# Patient Record
Sex: Male | Born: 1995 | Race: White | Hispanic: No | Smoking: Never smoker
Health system: Southern US, Community
[De-identification: ages and names within clinical notes are randomized; demographics above are authoritative.]

## PROBLEM LIST (undated history)

## (undated) HISTORY — PX: HAND SURGERY: SHX662

---
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
## 2017-10-10 ENCOUNTER — Encounter (HOSPITAL_COMMUNITY): Payer: Self-pay

## 2017-10-10 ENCOUNTER — Emergency Department (HOSPITAL_COMMUNITY): Payer: Medicaid Other

## 2017-10-10 ENCOUNTER — Emergency Department (HOSPITAL_COMMUNITY)
Admission: EM | Admit: 2017-10-10 | Discharge: 2017-10-10 | Disposition: A | Discharge status: Home / Self Care | Payer: Medicaid Other | Attending: Emergency Medicine | Admitting: Emergency Medicine

## 2017-10-10 DIAGNOSIS — Z79899 Other long term (current) drug therapy: Secondary | ICD-10-CM | POA: Insufficient documentation

## 2017-10-10 DIAGNOSIS — S0083XA Contusion of other part of head, initial encounter: Secondary | ICD-10-CM

## 2017-10-10 DIAGNOSIS — Y9389 Activity, other specified: Secondary | ICD-10-CM | POA: Diagnosis not present

## 2017-10-10 DIAGNOSIS — F17210 Nicotine dependence, cigarettes, uncomplicated: Secondary | ICD-10-CM | POA: Diagnosis not present

## 2017-10-10 DIAGNOSIS — Y998 Other external cause status: Secondary | ICD-10-CM | POA: Insufficient documentation

## 2017-10-10 DIAGNOSIS — S060X0A Concussion without loss of consciousness, initial encounter: Secondary | ICD-10-CM | POA: Diagnosis not present

## 2017-10-10 DIAGNOSIS — Y929 Unspecified place or not applicable: Secondary | ICD-10-CM | POA: Insufficient documentation

## 2017-10-10 DIAGNOSIS — S0990XA Unspecified injury of head, initial encounter: Secondary | ICD-10-CM | POA: Diagnosis present

## 2017-10-10 MED ORDER — ACETAMINOPHEN 500 MG PO TABS
1000.0000 mg | ORAL_TABLET | Freq: Once | ORAL | Status: AC
  Administered 2017-10-10: 1000 mg via ORAL
  Filled 2017-10-10: qty 2
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
## 2017-10-10 NOTE — Discharge Instructions (Signed)
You have been diagnosed with a concussion. Call the concussion hotline tomorrow for follow up [phone].  Ibuprofen or Tylenol for pain Rest, ice on head.  Stay in a quiet, non-simulating, dark environment. No TV, computer use, video games until headache is resolved completely.  Follow up with your primary care physician if headache persists.  Return to the emergency department if patient becomes lethargic, begins vomiting or other change in mental status.  HEAD INJURY If any of the following occur notify your physician or go to the Hospital Emergency Department:  Increased drowsiness, stupor or loss of consciousness  Temperature above 100 F  Vomiting  Severe headache  Blood or clear fluid dripping from the nose or ears  Stiffness of the neck  Dizziness or blurred vision  Any other unusual symptoms  PRECAUTIONS  Keep head elevated at all times for the first 24 hours (Elevate mattress if pillow is ineffective)  Do not take sedatives, narcotics or alcohol  Avoid aspirin. Use only acetaminophen (e.g. Tylenol) or ibuprofen (e.g. Advil) for relief of pain. Follow directions on the bottle for dosage.  Use ice packs for comfort
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
## 2021-12-05 ENCOUNTER — Ambulatory Visit (HOSPITAL_COMMUNITY)
Admission: EM | Admit: 2021-12-05 | Discharge: 2021-12-05 | Disposition: A | Discharge status: Home / Self Care | Payer: BC Managed Care – PPO | Attending: Emergency Medicine | Admitting: Emergency Medicine

## 2021-12-05 ENCOUNTER — Other Ambulatory Visit: Payer: Self-pay

## 2021-12-05 ENCOUNTER — Encounter (HOSPITAL_COMMUNITY): Payer: Self-pay | Admitting: Emergency Medicine

## 2021-12-05 DIAGNOSIS — Z202 Contact with and (suspected) exposure to infections with a predominantly sexual mode of transmission: Secondary | ICD-10-CM | POA: Diagnosis not present

## 2021-12-05 DIAGNOSIS — Z113 Encounter for screening for infections with a predominantly sexual mode of transmission: Secondary | ICD-10-CM | POA: Insufficient documentation

## 2021-12-05 NOTE — ED Provider Notes (Signed)
[facility]    CSN: [id number] Arrival date & time: 12/05/21  1650    HISTORY   Chief Complaint  Patient presents with   SEXUALLY TRANSMITTED DISEASE   HPI [name] [name] is a 26 y.o. male. Patient presents for STD screening.  Patient reports new sexual partner, states he recently had unprotected sexual intercourse, patient denies having any symptoms such as discharge, burning with urination, scrotal pain or tenderness, testicular pain or tenderness, scrotal swelling, genital lesions.  Patient denies known exposure to STD.  Patient denies known history of STD.  The history is provided by the patient.  History reviewed. No pertinent past medical history. Patient Active Problem List   Diagnosis Date Noted   Mood disorder in conditions classified elsewhere 04/05/2015   Polysubstance abuse (HCC) 04/05/2015   Marijuana abuse 04/04/2015   Depression, major, single episode, severe (HCC) 04/04/2015   Suicide threat or attempt 04/04/2015   Suicidal ideation    Past Surgical History:  Procedure Laterality Date   CLOSED REDUCTION NASAL FRACTURE N/A 03/18/2015   Procedure: CLOSED REDUCTION NASAL FRACTURE WITH STABILIZATION;  Surgeon: Flo Shanks, MD;  Location: [facility] OR;  Service: ENT;  Laterality: N/A;   HAND SURGERY Left     Home Medications    Prior to Admission medications   Not on File   Family History No family history on file. Social History Social History   Tobacco Use   Smoking status: Every Day    Packs/day: 1.00    Types: Cigarettes   Smokeless tobacco: Never  Substance Use Topics   Alcohol use: No   Drug use: Yes    Types: Marijuana   Allergies   Root beer flavor  Review of Systems Review of Systems Pertinent findings noted in history of present illness.   Physical Exam Triage Vital Signs ED Triage Vitals  Enc Vitals Group     BP 09/12/21 0827 (!) 147/82     Pulse Rate 09/12/21 0827 72     Resp 09/12/21 0827 18     Temp 09/12/21 0827  98.3 F (36.8 C)     Temp Source 09/12/21 0827 Oral     SpO2 09/12/21 0827 98 %     Weight --      Height --      Head Circumference --      Peak Flow --      Pain Score 09/12/21 0826 5     Pain Loc --      Pain Edu? --      Excl. in GC? --   No data found.  Updated Vital Signs BP (!) 139/92 (BP Location: Left Arm)    Pulse 99    Temp 98.5 F (36.9 C) (Oral)    Resp 16    SpO2 98%   Physical Exam Vitals and nursing note reviewed.  Constitutional:      General: He is not in acute distress.    Appearance: Normal appearance. He is not ill-appearing.  HENT:     Head: Normocephalic and atraumatic.  Eyes:     General: Lids are normal.        Right eye: No discharge.        Left eye: No discharge.     Extraocular Movements: Extraocular movements intact.     Conjunctiva/sclera: Conjunctivae normal.     Right eye: Right conjunctiva is not injected.     Left eye: Left conjunctiva is not injected.  Neck:     Trachea:  Trachea and phonation normal.  Cardiovascular:     Rate and Rhythm: Normal rate and regular rhythm.     Pulses: Normal pulses.     Heart sounds: Normal heart sounds. No murmur heard.   No friction rub. No gallop.  Pulmonary:     Effort: Pulmonary effort is normal. No accessory muscle usage, prolonged expiration or respiratory distress.     Breath sounds: Normal breath sounds. No stridor, decreased air movement or transmitted upper airway sounds. No decreased breath sounds, wheezing, rhonchi or rales.  Chest:     Chest wall: No tenderness.  Genitourinary:    Comments: Pt politely declines GU exam, pt did provide a penile swab for testing.   Musculoskeletal:        General: Normal range of motion.     Cervical back: Normal range of motion and neck supple. Normal range of motion.  Lymphadenopathy:     Cervical: No cervical adenopathy.  Skin:    General: Skin is warm and dry.     Findings: No erythema or rash.  Neurological:     General: No focal deficit  present.     Mental Status: He is alert and oriented to person, place, and time.  Psychiatric:        Mood and Affect: Mood normal.        Behavior: Behavior normal.    Visual Acuity Right Eye Distance:   Left Eye Distance:   Bilateral Distance:    Right Eye Near:   Left Eye Near:    Bilateral Near:     UC Couse / Diagnostics / Procedures:    EKG  Radiology No results found.  Procedures Procedures (including critical care time)  UC Diagnoses / Final Clinical Impressions(s)   I have reviewed the triage vital signs and the nursing notes.  Pertinent labs & imaging results that were available during my care of the patient were reviewed by me and considered in my medical decision making (see chart for details).    Final diagnoses:  Screening examination for STD (sexually transmitted disease)   STD screening was performed, patient advised that the results be posted to their MyChart and if any of the results are positive, they will be notified by phone, further treatment will be provided as indicated based on results of STD screening.  ED Prescriptions   None    PDMP not reviewed this encounter.  Pending results:  Labs Reviewed  CYTOLOGY, (ORAL, ANAL, URETHRAL) ANCILLARY ONLY    Medications Ordered in UC: Medications - No data to display  Disposition Upon Discharge:  Condition: stable for discharge home Home: take medications as prescribed; routine discharge instructions as discussed; follow up as advised.  Patient presented with an acute illness with associated systemic symptoms and significant discomfort requiring urgent management. In my opinion, this is a condition that a prudent lay person (someone who possesses an average knowledge of health and medicine) may potentially expect to result in complications if not addressed urgently such as respiratory distress, impairment of bodily function or dysfunction of bodily organs.   Routine symptom specific, illness  specific and/or disease specific instructions were discussed with the patient and/or caregiver at length.   As such, the patient has been evaluated and assessed, work-up was performed and treatment was provided in alignment with urgent care protocols and evidence based medicine.  Patient/parent/caregiver has been advised that the patient may require follow up for further testing and treatment if the symptoms continue in spite of treatment,  as clinically indicated and appropriate.  Patient/parent/caregiver has been advised to return to the [facility] or PCP in 3-5 days if no better; to PCP or the Emergency Department if new signs and symptoms develop, or if the current signs or symptoms continue to change or worsen for further workup, evaluation and treatment as clinically indicated and appropriate  The patient will follow up with their current PCP if and as advised. If the patient does not currently have a PCP we will assist them in obtaining one.   The patient may need specialty follow up if the symptoms continue, in spite of conservative treatment and management, for further workup, evaluation, consultation and treatment as clinically indicated and appropriate.  Patient/parent/caregiver verbalized understanding and agreement of plan as discussed.  All questions were addressed during visit.  Please see discharge instructions below for further details of plan.  Discharge Instructions:   Discharge Instructions      The results of your STD testing today will be made available to you once received.  They will initially be posted to your MyChart and, if any of your results are abnormal, you will receive a phone call with those results along with further instructions regarding any further treatment, if needed.        This office note has been dictated using Teaching laboratory technician.  Unfortunately, and despite my best efforts, this method of dictation can sometimes lead to occasional  typographical or grammatical errors.  I apologize in advance if this occurs.      Theadora Rama Scales, PA-C 12/05/21 1722
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
## 2021-12-05 NOTE — Discharge Instructions (Addendum)
The results of your STD testing today will be made available to you once received.  They will initially be posted to your MyChart and, if any of your results are abnormal, you will receive a phone call with those results along with further instructions regarding any further treatment, if needed.

## 2021-12-08 LAB — CYTOLOGY, (ORAL, ANAL, URETHRAL) ANCILLARY ONLY
Chlamydia: NEGATIVE
Comment: NEGATIVE
Comment: NEGATIVE
Comment: NORMAL
Neisseria Gonorrhea: NEGATIVE
Trichomonas: NEGATIVE

## 2023-05-23 ENCOUNTER — Other Ambulatory Visit: Payer: Self-pay

## 2023-05-23 ENCOUNTER — Encounter (HOSPITAL_COMMUNITY): Payer: Self-pay

## 2023-05-23 ENCOUNTER — Emergency Department (HOSPITAL_COMMUNITY)
Admission: EM | Admit: 2023-05-23 | Discharge: 2023-05-24 | Disposition: A | Discharge status: Home / Self Care | Payer: Medicaid Other | Source: Home / Self Care | Attending: Emergency Medicine | Admitting: Emergency Medicine

## 2023-05-23 DIAGNOSIS — T1491XA Suicide attempt, initial encounter: Secondary | ICD-10-CM | POA: Insufficient documentation

## 2023-05-23 DIAGNOSIS — X58XXXA Exposure to other specified factors, initial encounter: Secondary | ICD-10-CM | POA: Insufficient documentation

## 2023-05-23 DIAGNOSIS — R000 Tachycardia, unspecified: Secondary | ICD-10-CM | POA: Insufficient documentation

## 2023-05-23 DIAGNOSIS — F1099 Alcohol use, unspecified with unspecified alcohol-induced disorder: Secondary | ICD-10-CM | POA: Insufficient documentation

## 2023-05-23 DIAGNOSIS — F1994 Other psychoactive substance use, unspecified with psychoactive substance-induced mood disorder: Secondary | ICD-10-CM | POA: Diagnosis present

## 2023-05-23 DIAGNOSIS — F17210 Nicotine dependence, cigarettes, uncomplicated: Secondary | ICD-10-CM | POA: Insufficient documentation

## 2023-05-23 DIAGNOSIS — F1094 Alcohol use, unspecified with alcohol-induced mood disorder: Secondary | ICD-10-CM

## 2023-05-23 LAB — COMPREHENSIVE METABOLIC PANEL
ALT: 31 U/L (ref 0–44)
AST: 37 U/L (ref 15–41)
Albumin: 4.6 g/dL (ref 3.5–5.0)
Alkaline Phosphatase: 65 U/L (ref 38–126)
Anion gap: 11 (ref 5–15)
BUN: 9 mg/dL (ref 6–20)
CO2: 22 mmol/L (ref 22–32)
Calcium: 8.8 mg/dL — ABNORMAL LOW (ref 8.9–10.3)
Chloride: 105 mmol/L (ref 98–111)
Creatinine, Ser: 0.83 mg/dL (ref 0.61–1.24)
GFR, Estimated: >60 mL/min (ref >60)
Glucose, Bld: 97 mg/dL (ref 70–99)
Potassium: 3.5 mmol/L (ref 3.5–5.1)
Sodium: 138 mmol/L (ref 135–145)
Total Bilirubin: 0.6 mg/dL (ref 0.3–1.2)
Total Protein: 8 g/dL (ref 6.5–8.1)

## 2023-05-23 LAB — ETHANOL: Alcohol, Ethyl (B): 160 mg/dL — ABNORMAL HIGH (ref <10)

## 2023-05-23 LAB — CBC WITH DIFFERENTIAL/PLATELET
Abs Immature Granulocytes: 0.02 10*3/uL (ref 0.00–0.07)
Basophils Absolute: 0.1 10*3/uL (ref 0.0–0.1)
Basophils Relative: 1 %
Eosinophils Absolute: 0.1 10*3/uL (ref 0.0–0.5)
Eosinophils Relative: 1 %
HCT: 47.2 % (ref 39.0–52.0)
Hemoglobin: 15.3 g/dL (ref 13.0–17.0)
Immature Granulocytes: 0 %
Lymphocytes Relative: 27 %
Lymphs Abs: 2.1 10*3/uL (ref 0.7–4.0)
MCH: 30.8 pg (ref 26.0–34.0)
MCHC: 32.4 g/dL (ref 30.0–36.0)
MCV: 95 fL (ref 80.0–100.0)
Monocytes Absolute: 0.5 10*3/uL (ref 0.1–1.0)
Monocytes Relative: 6 %
Neutro Abs: 5 10*3/uL (ref 1.7–7.7)
Neutrophils Relative %: 65 %
Platelets: 240 10*3/uL (ref 150–400)
RBC: 4.97 MIL/uL (ref 4.22–5.81)
RDW: 13 % (ref 11.5–15.5)
WBC: 7.7 10*3/uL (ref 4.0–10.5)
nRBC: 0 % (ref 0.0–0.2)

## 2023-05-23 LAB — RAPID URINE DRUG SCREEN, HOSP PERFORMED
Amphetamines: NOT DETECTED
Barbiturates: NOT DETECTED
Benzodiazepines: NOT DETECTED
Cocaine: POSITIVE — AB
Opiates: NOT DETECTED
Tetrahydrocannabinol: POSITIVE — AB

## 2023-05-23 MED ORDER — ADULT MULTIVITAMIN W/MINERALS CH
1.0000 | ORAL_TABLET | Freq: Every day | ORAL | Status: DC
  Administered 2023-05-23 – 2023-05-24 (×2): 1 via ORAL
  Filled 2023-05-23 (×2): qty 1

## 2023-05-23 MED ORDER — LORAZEPAM 2 MG/ML IJ SOLN: 1.0000 mg | INTRAMUSCULAR | Status: DC | PRN

## 2023-05-23 MED ORDER — FOLIC ACID 1 MG PO TABS
1.0000 mg | ORAL_TABLET | Freq: Every day | ORAL | Status: DC
  Administered 2023-05-23 – 2023-05-24 (×2): 1 mg via ORAL
  Filled 2023-05-23 (×2): qty 1

## 2023-05-23 MED ORDER — THIAMINE HCL 100 MG/ML IJ SOLN: 100.0000 mg | Freq: Every day | INTRAMUSCULAR | Status: DC

## 2023-05-23 MED ORDER — LORAZEPAM 1 MG PO TABS: 1.0000 mg | ORAL_TABLET | ORAL | Status: DC | PRN

## 2023-05-23 MED ORDER — THIAMINE MONONITRATE 100 MG PO TABS
100.0000 mg | ORAL_TABLET | Freq: Every day | ORAL | Status: DC
  Administered 2023-05-23 – 2023-05-24 (×2): 100 mg via ORAL
  Filled 2023-05-23 (×2): qty 1
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
## 2023-05-23 NOTE — Consult Note (Signed)
[facility] ED ASSESSMENT   Reason for Consult:  Psychiatry evaluation Referring Physician:  ER Physician Patient Identification: [name] [name] MRN:  [id number] ED Chief Complaint: Attempted suicide [facility])  Diagnosis:  Principal Problem:   Attempted suicide [facility]) Active Problems:   Substance induced mood disorder (HCC)   Alcohol use with alcohol-induced disorder [facility])   ED Assessment Time Calculation: Start Time: 1753 Stop Time: 1824 Total Time in Minutes (Assessment Completion): 31   Subjective:   [name] is a 27 y.o. male patient admitted with previous hx of .Suicide ideation/attempt, Alcohol use disorder, Cannabis use disorder and Depression was brought in by [facility] after his GF called after patient made a suicide statement and showed the Police a Video of him trying to hang himself.  HPI:  Patient was seen this evening for evaluation.  He admitted to sending a video of him trying to hang himself to his girl Friend.  Patient states he did not want to kill himself but wanted to get his girl friend worried.  Patient reports that his GF who is soon to deliver twin girls always insults him, tells him to kill himself.  Patient states that his GF makes him feel like nothing.  Patient reports that his GF breaks his heart daily by finding every means on earth to make him feel worthless.  Patient denies family hx of suicide and denies ever trying to hurt self in the past.  When asked about drug patient reported using Cannabis and drinks occasionally.  Patient admits to poor sleep and appetite.  He admits to not being able to keep a job for long.    Patient and his GF lives with his parents and grandmother.   Male, 27 years old who appears older than stated age was seen emaciated, disheveled and restless.  He denied using alcohol in 24 hrs but Alcohol level was 160 and UDS is positive for Cannabis and Marijuana.  Patient denies SI/HI/AVH.Marland Kitchen Collateral information from mom, Dad and GF is that ever since  patient started using Alcohol and drugs as a teenager he has not been himself.  Parents reports that he has been dealing with extensive alcohol and various drug use.Marland Kitchen  He has not been to any rehab treatment before.  His father reported  a time he was supposed to be in court for a hearing and inside the court he brought out a blade and cut his left wrist deeply and needed sutures.  Mother reports that he is always making suicide comments around in the house. Patient is a threat to himself and meets criteria for inpatient Psychiatric hospitalization.  We will seek bed placement at any facility that has available bed.  We will place patient on CIWA protocol using Ativan.  Past Psychiatric History: previous hx of .Suicide ideation/attempt, Alcohol use disorder, Cannabis use disorder and Depression.  No previous detox treatment of mental health care  Risk to Self or Others: Is the patient at risk to self? Yes Has the patient been a risk to self in the past 6 months? yes Has the patient been a risk to self within the distant past? Yes Is the patient a risk to others? No Has the patient been a risk to others in the past 6 months? No Has the patient been a risk to others within the distant past? No  [place] Scale:  Flowsheet Row ED from 05/23/2023 in [facility] Emergency Department at [facility] ED from 12/05/2021 in [facility] at [facility]  C-SSRS RISK CATEGORY No Risk No Risk       AIMS:  , , ,  ,   ASAM:    Substance Abuse:     Past Medical History: History reviewed. No pertinent past medical history.  Past Surgical History:  Procedure Laterality Date   CLOSED REDUCTION NASAL FRACTURE N/A 03/18/2015   Procedure: CLOSED REDUCTION NASAL FRACTURE WITH STABILIZATION;  Surgeon: Flo Shanks, MD;  Location: [facility] OR;  Service: ENT;  Laterality: N/A;   HAND SURGERY Left    Family History: History reviewed. No pertinent family history. Family Psychiatric  History: Parents  denied Social History:  Social History   Substance and Sexual Activity  Alcohol Use No     Social History   Substance and Sexual Activity  Drug Use Yes   Types: Marijuana    Social History   Socioeconomic History   Marital status: Single    Spouse name: Not on file   Number of children: Not on file   Years of education: Not on file   Highest education level: Not on file  Occupational History   Not on file  Tobacco Use   Smoking status: Every Day    Packs/day: 1    Types: Cigarettes   Smokeless tobacco: Never  Substance and Sexual Activity   Alcohol use: No   Drug use: Yes    Types: Marijuana   Sexual activity: Yes    Birth control/protection: Condom  Other Topics Concern   Not on file  Social History Narrative   Not on file   Social Determinants of Health   Financial Resource Strain: Not on file  Food Insecurity: Not on file  Transportation Needs: Not on file  Physical Activity: Not on file  Stress: Not on file  Social Connections: Not on file   Additional Social History:    Allergies:   Allergies  Allergen Reactions   Root Beer Flavor Nausea Only    And cream soda    Labs:  Results for orders placed or performed during the hospital encounter of 05/23/23 (from the past 48 hour(s))  Comprehensive metabolic panel     Status: Abnormal   Collection Time: 05/23/23 12:46 PM  Result Value Ref Range   Sodium 138 135 - 145 mmol/L   Potassium 3.5 3.5 - 5.1 mmol/L   Chloride 105 98 - 111 mmol/L   CO2 22 22 - 32 mmol/L   Glucose, Bld 97 70 - 99 mg/dL    Comment: Glucose reference range applies only to samples taken after fasting for at least 8 hours.   BUN 9 6 - 20 mg/dL   Creatinine, Ser 4.09 0.61 - 1.24 mg/dL   Calcium 8.8 (L) 8.9 - 10.3 mg/dL   Total Protein 8.0 6.5 - 8.1 g/dL   Albumin 4.6 3.5 - 5.0 g/dL   AST 37 15 - 41 U/L   ALT 31 0 - 44 U/L   Alkaline Phosphatase 65 38 - 126 U/L   Total Bilirubin 0.6 0.3 - 1.2 mg/dL   GFR, Estimated >81 >19  mL/min    Comment: (NOTE) Calculated using the CKD-EPI Creatinine Equation (2021)    Anion gap 11 5 - 15    Comment: Performed at [facility], [address] [place], [place] [address]  Ethanol     Status: Abnormal   Collection Time: 05/23/23 12:46 PM  Result Value Ref Range   Alcohol, Ethyl (B) 160 (H) <10 mg/dL    Comment: (NOTE) Lowest  detectable limit for serum alcohol is 10 mg/dL.  For medical purposes only. Performed at [facility], [address], [place], [place] [address]   Urine rapid drug screen (hosp performed)     Status: Abnormal   Collection Time: 05/23/23 12:46 PM  Result Value Ref Range   Opiates NONE DETECTED NONE DETECTED   Cocaine POSITIVE (A) NONE DETECTED   Benzodiazepines NONE DETECTED NONE DETECTED   Amphetamines NONE DETECTED NONE DETECTED   Tetrahydrocannabinol POSITIVE (A) NONE DETECTED   Barbiturates NONE DETECTED NONE DETECTED    Comment: (NOTE) DRUG SCREEN FOR MEDICAL PURPOSES ONLY.  IF CONFIRMATION IS NEEDED FOR ANY PURPOSE, NOTIFY LAB WITHIN 5 DAYS.  LOWEST DETECTABLE LIMITS FOR URINE DRUG SCREEN Drug Class                     Cutoff (ng/mL) Amphetamine and metabolites    1000 Barbiturate and metabolites    200 Benzodiazepine                 200 Opiates and metabolites        300 Cocaine and metabolites        300 THC                            50 Performed at [facility], [address] [place], [place] [address]   CBC with Diff     Status: None   Collection Time: 05/23/23 12:46 PM  Result Value Ref Range   WBC 7.7 4.0 - 10.5 K/uL   RBC 4.97 4.22 - 5.81 MIL/uL   Hemoglobin 15.3 13.0 - 17.0 g/dL   HCT 09.8 11.9 - 14.7 %   MCV 95.0 80.0 - 100.0 fL   MCH 30.8 26.0 - 34.0 pg   MCHC 32.4 30.0 - 36.0 g/dL   RDW 82.9 56.2 - 13.0 %   Platelets 240 150 - 400 K/uL   nRBC 0.0 0.0 - 0.2 %   Neutrophils Relative % 65 %   Neutro Abs 5.0 1.7 - 7.7 K/uL   Lymphocytes Relative 27 %    Lymphs Abs 2.1 0.7 - 4.0 K/uL   Monocytes Relative 6 %   Monocytes Absolute 0.5 0.1 - 1.0 K/uL   Eosinophils Relative 1 %   Eosinophils Absolute 0.1 0.0 - 0.5 K/uL   Basophils Relative 1 %   Basophils Absolute 0.1 0.0 - 0.1 K/uL   Immature Granulocytes 0 %   Abs Immature Granulocytes 0.02 0.00 - 0.07 K/uL    Comment: Performed at [facility] [address], [place], [place] [address]    Current Facility-Administered Medications  Medication Dose Route Frequency Provider Last Rate Last Admin   folic acid (FOLVITE) tablet 1 mg  1 mg Oral Daily Norvella Loscalzo C, NP       LORazepam (ATIVAN) tablet 1-4 mg  1-4 mg Oral Q1H PRN Dahlia Byes C, NP       Or   LORazepam (ATIVAN) injection 1-4 mg  1-4 mg Intravenous Q1H PRN Dahlia Byes C, NP       multivitamin with minerals tablet 1 tablet  1 tablet Oral Daily Yanilen Adamik C, NP       thiamine (VITAMIN B1) tablet 100 mg  100 mg Oral Daily Naida Escalante C, NP       Or   thiamine (VITAMIN B1) injection 100 mg  100 mg Intravenous Daily Sarie Stall,  Hessie Dibble, NP       No current outpatient medications on file.    Musculoskeletal: Strength & Muscle Tone: within normal limits Gait & Station: normal Patient leans: Front   Psychiatric Specialty Exam: Presentation  General Appearance:  Disheveled; Casual  Eye Contact: Good  Speech: Clear and Coherent; Pressured  Speech Volume: Normal  Handedness: Right   Mood and Affect  Mood: Angry; Anxious; Irritable  Affect: Congruent; Tearful   Thought Process  Thought Processes: Coherent; Goal Directed; Linear  Descriptions of Associations:Circumstantial  Orientation:No data recorded Thought Content:Logical; Illogical; Scattered  History of Schizophrenia/Schizoaffective disorder:No data recorded Duration of Psychotic Symptoms:No data recorded Hallucinations:Hallucinations: None  Ideas of Reference:None  Suicidal Thoughts:Suicidal  Thoughts: No  Homicidal Thoughts:Homicidal Thoughts: No   Sensorium  Memory: Immediate Good; Recent Good; Remote Fair  Judgment: Impaired  Insight: Lacking   Executive Functions  Concentration: Poor  Attention Span: Poor  Recall:No data recorded Fund of Knowledge: Fair  Language: Good   Psychomotor Activity  Psychomotor Activity: Psychomotor Activity: Normal   Assets  Assets: Communication Skills; Intimacy; Housing; Desire for Improvement; Physical Health    Sleep  Sleep: Sleep: Fair   Physical Exam: Physical Exam Vitals and nursing note reviewed.  HENT:     Head: Normocephalic and atraumatic.     Nose: Nose normal.  Cardiovascular:     Rate and Rhythm: Normal rate and regular rhythm.  Pulmonary:     Effort: Pulmonary effort is normal.  Musculoskeletal:        General: Normal range of motion.     Cervical back: Normal range of motion.  Skin:    General: Skin is warm and dry.  Neurological:     Mental Status: He is alert and oriented to person, place, and time.  Psychiatric:        Attention and Perception: He is inattentive.        Mood and Affect: Mood is anxious and elated. Affect is tearful.        Speech: Speech is rapid and pressured.        Behavior: Behavior is hyperactive. Behavior is cooperative.        Thought Content: Thought content includes suicidal ideation.        Cognition and Memory: Cognition and memory normal.        Judgment: Judgment is impulsive and inappropriate.    Review of Systems  Constitutional:  Positive for weight loss.  HENT: Negative.    Eyes: Negative.   Respiratory: Negative.    Cardiovascular: Negative.   Gastrointestinal: Negative.   Genitourinary: Negative.   Musculoskeletal: Negative.   Skin: Negative.   Neurological: Negative.   Endo/Heme/Allergies: Negative.   Psychiatric/Behavioral:  Positive for substance abuse and suicidal ideas. The patient is nervous/anxious.    Blood pressure (!)  156/101, pulse (!) 121, temperature 98.3 F (36.8 C), temperature source Oral, resp. rate 20, height 5\' 10"  (1.778 m), weight 72.6 kg, SpO2 100 %. Body mass index is 22.96 kg/m.  Medical Decision Making: Patient is a threat to himself and meets criteria for inpatient Psychiatric hospitalization.  We will seek bed placement at any facility that has available bed.  We will place patient on CIWA protocol using Ativan  Problem 1: Suicide attempt  Problem 2: Alcohol use with alcohol induced mood disorder  Problem 3: Substance induced mood disorder  Disposition:  admit, seek bed placement.  Earney Navy, NP-PMHNP-BC 05/23/2023 6:27 PM

## 2023-05-23 NOTE — ED Notes (Signed)
Psych NP at bedside

## 2023-05-23 NOTE — ED Notes (Signed)
Pt placed personal phone call at this time.

## 2023-05-23 NOTE — ED Notes (Signed)
Pt has one bag of belongings in cabinet labeled 16-18.  Pt has been seen and wand by security.

## 2023-05-23 NOTE — ED Notes (Signed)
Vitals will be taken when pt wakes up

## 2023-05-23 NOTE — ED Notes (Signed)
Pt's girlfriend at bedside visiting.

## 2023-05-23 NOTE — ED Provider Notes (Signed)
[place] EMERGENCY DEPARTMENT AT [facility] Provider Note   CSN: [id number] Arrival date & time: 05/23/23  1220     History  Chief Complaint  Patient presents with   Psychiatric Evaluation    [name] [name] is a 27 y.o. male with history of polysubstance abuse, depression, mood disorder, who presents the emergency department under IVC.  Per IVC paperwork, "respondent sent pictures and text messages started to hang himself.  Respondent threatened to harm himself by wrecking his truck while on the phone with his girlfriend."  Patient denies active SI, HI, or AVH. States he just took those pictures/videos because he was trying to "get back at" his girlfriend due to her verbal abuse. States they are having a child together in about 5 weeks or so. Reports girlfriend has been telling him he is worthless and would be better off dead. He denies any intent to harm himself now and that he "just did something stupid". Endorses having a "drinking problem" as a way to cope with the verbal abuse he receives.   HPI     Home Medications Prior to Admission medications   Not on File      Allergies    Root beer flavor    Review of Systems   Review of Systems  Psychiatric/Behavioral:  Positive for suicidal ideas.   All other systems reviewed and are negative.   Physical Exam Updated Vital Signs BP (!) 156/101 (BP Location: Right Arm)   Pulse (!) 121   Temp 98.3 F (36.8 C) (Oral)   Resp 20   Ht 5\' 10"  (1.778 m)   Wt 72.6 kg   SpO2 100%   BMI 22.96 kg/m  Physical Exam Vitals and nursing note reviewed.  Constitutional:      Appearance: Normal appearance.  HENT:     Head: Normocephalic and atraumatic.  Eyes:     Conjunctiva/sclera: Conjunctivae normal.  Pulmonary:     Effort: Pulmonary effort is normal. No respiratory distress.  Skin:    General: Skin is warm and dry.  Neurological:     Mental Status: He is alert.  Psychiatric:        Attention and Perception:  Attention and perception normal.        Mood and Affect: Mood normal. Affect is tearful.        Speech: Speech normal.        Behavior: Behavior normal. Behavior is cooperative.        Thought Content: Thought content includes suicidal ideation. Thought content does not include homicidal ideation. Thought content does not include homicidal or suicidal plan.     ED Results / Procedures / Treatments   Labs (all labs ordered are listed, but only abnormal results are displayed) Labs Reviewed  COMPREHENSIVE METABOLIC PANEL - Abnormal; Notable for the following components:      Result Value   Calcium 8.8 (*)    All other components within normal limits  ETHANOL - Abnormal; Notable for the following components:   Alcohol, Ethyl (B) 160 (*)    All other components within normal limits  RAPID URINE DRUG SCREEN, HOSP PERFORMED - Abnormal; Notable for the following components:   Cocaine POSITIVE (*)    Tetrahydrocannabinol POSITIVE (*)    All other components within normal limits  CBC WITH DIFFERENTIAL/PLATELET    EKG EKG Interpretation Date/Time:  Sunday May 23 2023 13:25:45 EDT Ventricular Rate:  106 PR Interval:  126 QRS Duration:  90 QT Interval:  346 QTC  Calculation: 459 R Axis:   64  Text Interpretation: Sinus tachycardia Otherwise normal ECG No significant change since last tracing Confirmed by Elayne Snare ([id number]) on 05/23/2023 1:32:58 PM  Radiology No results found.  Procedures Procedures    Medications Ordered in ED Medications - No data to display  ED Course/ Medical Decision Making/ A&P                             Medical Decision Making Amount and/or Complexity of Data Reviewed Labs: ordered.   Patient is a 27 y.o. male  who presents to the emergency department for psychiatric complaint. Brought in under IVC after he sent texts and images of him threatening suicide.   Past Medical History: polysubstance abuse, depression, mood disorder  Physical  Exam: Hypertensive and tachycardic, otherwise normal vitals. No distress. Sitting comfortably in exam bed. Denies active SI, HI, or AVH.  Labs: Medical clearance labs ordered, with following pertinent results: CBC and CMP unremarkable. Ethanol 160. UDS positive for cocaine and THC.   Cardiac monitoring: EKG obtained and interpreted by attending physician which shows: sinus tachycardia  Disposition: Patient is otherwise medically cleared at this time pending medical clearance laboratory evaluation. Will consult TTS and appreciate their recommendations. Patient here under IVC. Although he does not have active threats of suicide and claims not to be a danger to himself, his threats/actions earlier today are very concerning and do not feel we can contract for safety under current circumstances. Would appreciate psychiatric evaluation and assistance in disposition. IVC first exam completed.   I discussed this case with my attending physician Dr. Theresia Lo who cosigned this note including patient's presenting symptoms, physical exam, and planned diagnostics and interventions. Attending physician stated agreement with plan or made changes to plan which were implemented.  1816 --  Behavioral health NP Dahlia Byes evaluated patient and believes he meets criteria for inpatient psychiatric treatment. Diet ordered. No home medications to be ordered.   Final Clinical Impression(s) / ED Diagnoses Final diagnoses:  Attempted suicide (HCC)  Substance induced mood disorder (HCC)  Alcohol use with alcohol-induced mood disorder (HCC)    Rx / DC Orders ED Discharge Orders     None      Portions of this report may have been transcribed using voice recognition software. Every effort was made to ensure accuracy; however, inadvertent computerized transcription errors may be present.    Su Monks, PA-C 05/23/23 1818    Rexford Maus, DO 05/23/23 1927

## 2023-05-24 ENCOUNTER — Encounter (HOSPITAL_COMMUNITY): Payer: Self-pay | Admitting: Nurse Practitioner

## 2023-05-24 ENCOUNTER — Other Ambulatory Visit: Payer: Self-pay

## 2023-05-24 ENCOUNTER — Inpatient Hospital Stay (HOSPITAL_COMMUNITY)
Admission: AD | Admit: 2023-05-24 | Discharge: 2023-05-28 | Disposition: A | Discharge status: Home / Self Care | Payer: Medicaid Other | Source: Intra-hospital | Attending: Psychiatry | Admitting: Psychiatry

## 2023-05-24 DIAGNOSIS — F32A Depression, unspecified: Secondary | ICD-10-CM | POA: Diagnosis present

## 2023-05-24 DIAGNOSIS — Y906 Blood alcohol level of 120-199 mg/100 ml: Secondary | ICD-10-CM | POA: Diagnosis present

## 2023-05-24 DIAGNOSIS — R45851 Suicidal ideations: Secondary | ICD-10-CM | POA: Diagnosis present

## 2023-05-24 DIAGNOSIS — F1024 Alcohol dependence with alcohol-induced mood disorder: Principal | ICD-10-CM | POA: Diagnosis present

## 2023-05-24 DIAGNOSIS — F17210 Nicotine dependence, cigarettes, uncomplicated: Secondary | ICD-10-CM | POA: Diagnosis present

## 2023-05-24 DIAGNOSIS — T1491XA Suicide attempt, initial encounter: Principal | ICD-10-CM | POA: Diagnosis present

## 2023-05-24 DIAGNOSIS — Z9151 Personal history of suicidal behavior: Secondary | ICD-10-CM | POA: Diagnosis not present

## 2023-05-24 DIAGNOSIS — F1099 Alcohol use, unspecified with unspecified alcohol-induced disorder: Secondary | ICD-10-CM | POA: Diagnosis present

## 2023-05-24 DIAGNOSIS — F1020 Alcohol dependence, uncomplicated: Principal | ICD-10-CM | POA: Insufficient documentation

## 2023-05-24 DIAGNOSIS — F0630 Mood disorder due to known physiological condition, unspecified: Secondary | ICD-10-CM

## 2023-05-24 DIAGNOSIS — Z9152 Personal history of nonsuicidal self-harm: Secondary | ICD-10-CM

## 2023-05-24 DIAGNOSIS — F1410 Cocaine abuse, uncomplicated: Secondary | ICD-10-CM | POA: Diagnosis present

## 2023-05-24 MED ORDER — LORAZEPAM 1 MG PO TABS: 1.0000 mg | ORAL_TABLET | ORAL | Status: DC | PRN

## 2023-05-24 MED ORDER — ADULT MULTIVITAMIN W/MINERALS CH
1.0000 | ORAL_TABLET | Freq: Every day | ORAL | Status: DC
  Administered 2023-05-25 – 2023-05-28 (×4): 1 via ORAL
  Filled 2023-05-24 (×5): qty 1

## 2023-05-24 MED ORDER — ALUM & MAG HYDROXIDE-SIMETH 200-200-20 MG/5ML PO SUSP: 30.0000 mL | ORAL | Status: DC | PRN

## 2023-05-24 MED ORDER — ACETAMINOPHEN 325 MG PO TABS
650.0000 mg | ORAL_TABLET | Freq: Four times a day (QID) | ORAL | Status: DC | PRN
  Administered 2023-05-26: 650 mg via ORAL
  Filled 2023-05-24: qty 2

## 2023-05-24 MED ORDER — VITAMIN B-1 100 MG PO TABS
100.0000 mg | ORAL_TABLET | Freq: Every day | ORAL | Status: DC
  Administered 2023-05-25 – 2023-05-28 (×4): 100 mg via ORAL
  Filled 2023-05-24 (×5): qty 1

## 2023-05-24 MED ORDER — DIPHENHYDRAMINE HCL 25 MG PO CAPS: 50.0000 mg | ORAL_CAPSULE | Freq: Three times a day (TID) | ORAL | Status: DC | PRN

## 2023-05-24 MED ORDER — HALOPERIDOL 5 MG PO TABS: 5.0000 mg | ORAL_TABLET | Freq: Three times a day (TID) | ORAL | Status: DC | PRN

## 2023-05-24 MED ORDER — DIPHENHYDRAMINE HCL 50 MG/ML IJ SOLN: 50.0000 mg | Freq: Three times a day (TID) | INTRAMUSCULAR | Status: DC | PRN

## 2023-05-24 MED ORDER — LORAZEPAM 1 MG PO TABS: 1.0000 mg | ORAL_TABLET | Freq: Four times a day (QID) | ORAL | Status: AC | PRN

## 2023-05-24 MED ORDER — LORAZEPAM 1 MG PO TABS: 2.0000 mg | ORAL_TABLET | Freq: Three times a day (TID) | ORAL | Status: DC | PRN

## 2023-05-24 MED ORDER — THIAMINE HCL 100 MG/ML IJ SOLN: 100.0000 mg | Freq: Every day | INTRAMUSCULAR | Status: DC

## 2023-05-24 MED ORDER — LORAZEPAM 2 MG/ML IJ SOLN: 2.0000 mg | Freq: Three times a day (TID) | INTRAMUSCULAR | Status: DC | PRN

## 2023-05-24 MED ORDER — FOLIC ACID 1 MG PO TABS
1.0000 mg | ORAL_TABLET | Freq: Every day | ORAL | Status: DC
  Administered 2023-05-25 – 2023-05-28 (×4): 1 mg via ORAL
  Filled 2023-05-24 (×6): qty 1

## 2023-05-24 MED ORDER — MAGNESIUM HYDROXIDE 400 MG/5ML PO SUSP: 30.0000 mL | Freq: Every day | ORAL | Status: DC | PRN

## 2023-05-24 MED ORDER — LORAZEPAM 2 MG/ML IJ SOLN: 1.0000 mg | INTRAMUSCULAR | Status: DC | PRN

## 2023-05-24 MED ORDER — HALOPERIDOL LACTATE 5 MG/ML IJ SOLN: 5.0000 mg | Freq: Three times a day (TID) | INTRAMUSCULAR | Status: DC | PRN

## 2023-05-24 NOTE — Progress Notes (Signed)
Pt has been accepted to [facility] TODAY 05/24/2023. Bed assignment: 306-1  Pt meets inpatient criteria per Eligha Bridegroom, NP  Attending Physician will be Phineas Inches, MD  Report can be called to: - Adult unit: [phone]  Pt can arrive; [facility] to coordinate arrival time  Care Team Notified: [facility] Rona Ravens, RN, Eligha Bridegroom, NP, Myrlene Broker, RN, Dahlia Byes, NP, and Sharol Roussel, RN  [place], [place]  05/24/2023 10:56 AM

## 2023-05-24 NOTE — Plan of Care (Signed)
  Problem: Education: Goal: Knowledge of [place] [facility] Education information/materials will improve Outcome: Progressing   Problem: Education: Goal: Emotional status will improve Outcome: Progressing   Problem: Education: Goal: Verbalization of understanding the information provided will improve Outcome: Progressing   Problem: Activity: Goal: Sleeping patterns will improve Outcome: Progressing

## 2023-05-24 NOTE — Group Note (Signed)
Date:  05/24/2023 Time:  9:27 PM  Group Topic/Focus:  Wrap-Up Group:   The focus of this group is to help patients review their daily goal of treatment and discuss progress on daily workbooks.    Participation Level:  Active  Participation Quality:  Appropriate  Affect:  Appropriate  Cognitive:  Appropriate  Insight: Appropriate  Engagement in Group:  Engaged  Modes of Intervention:  Discussion  Additional Comments:  Pt had a great day of 10 out of 10. Happy he met some people that are good and polite. His goal is to stay positive and work on getting income to support his pregnant girlfriend. Wants to meet with a provider  Tayjon Halladay E Jaliana Medellin 05/24/2023, 9:27 PM

## 2023-05-24 NOTE — Tx Team (Signed)
Initial Treatment Plan 05/24/2023 2:08 PM Daily [place] [id number]    PATIENT STRESSORS: Financial difficulties   Health problems   Marital or family conflict     PATIENT STRENGTHS: Ability for insight  Capable of independent living  Communication skills  Supportive family/friends    PATIENT IDENTIFIED PROBLEMS: "To not allow what people say to bother him"  "Not to take things out on people"  Suicidal thoughts  Anxiety  Depression             DISCHARGE CRITERIA:  Ability to meet basic life and health needs Adequate post-discharge living arrangements Motivation to continue treatment in a less acute level of care  PRELIMINARY DISCHARGE PLAN: Attend aftercare/continuing care group Outpatient therapy Return to previous living arrangement  PATIENT/FAMILY INVOLVEMENT: This treatment plan has been presented to and reviewed with the patient, [name] [name], and/or family member.  The patient and family have been given the opportunity to ask questions and make suggestions.  Clarene Critchley, RN 05/24/2023, 2:08 PM

## 2023-05-24 NOTE — [facility] Group Notes (Signed)
Adult Psychoeducational Group Note  Date:  05/24/2023 Time:  10:22 PM  Group Topic/Focus:  Wrap-Up Group:   The focus of this group is to help patients review their daily goal of treatment and discuss progress on daily workbooks.  Participation Level:  Active  Participation Quality:  Attentive  Affect:  Appropriate  Cognitive:  Alert  Insight: Good  Engagement in Group:  Engaged  Modes of Intervention:  Discussion and Support  Additional Comments:  Pt attended and participated in AA group.  Maura Crandall Cassandra 05/24/2023, 10:22 PM

## 2023-05-24 NOTE — Progress Notes (Signed)
Admission Note: Patient is a 27 year old male admitted to the unit involuntarily for suicidal ideation with a plan to hang himself.  Per IVC report: patient sent a video of him trying to himself to his girlfriend who is pregnant with twins.  Patient denied it as a suicide attempt.  Stated he wanted to get his girlfriend worried.  Stated his girlfriend is always insulting him, constantly putting him down and telling him to go kill himself.  Stated goal is to work on not letting people get him angry/upset.  UDS is positive for Cocaine and THC.  Patient is alert and oriented x 4.  Presents with blunted affect and anxious mood.  Admission plan of care reviewed, consent signed.  Skin and personal belongings completed.  Skin is dry and intact.  No contraband found.  Patient oriented to the unit, staff and room.  Routine safety checks initiated.  Verbalizes understanding of unit rules/protocols.  Patient is safe on the unit.

## 2023-05-24 NOTE — ED Notes (Signed)
Pt provided with a [place] sandwich, drink, and graham crackers.

## 2023-05-24 NOTE — ED Notes (Signed)
Transport called to take patient to [facility]. 

## 2023-05-24 NOTE — Group Note (Unsigned)
Occupational Therapy Group Note  Group Topic:Coping Skills  Group Date: 05/24/2023 Start Time: 1430 End Time: 1505 Facilitators: Javyon Fontan G, OT   Group Description: Group encouraged increased engagement and participation through discussion and activity focused on "Coping Ahead." Patients were split up into teams and selected a card from a stack of positive coping strategies. Patients were instructed to act out/charade the coping skill for other peers to guess and receive points for their team. Discussion followed with a focus on identifying additional positive coping strategies and patients shared how they were going to cope ahead over the weekend while continuing hospitalization stay.  Therapeutic Goal(s): Identify positive vs negative coping strategies. Identify coping skills to be used during hospitalization vs coping skills outside of hospital/at home Increase participation in therapeutic group environment and promote engagement in treatment   Participation Level: {OT [facility] Participation Level:26267}   Participation Quality: {OT [facility] Participation Quality:26268}   Behavior: {B[facility] OT Group Behavior:26269}   Speech/Thought Process: {[facility] OT Speech/Thought Process:26270}   Affect/Mood: {OT [facility] Affect/Mood:26271}   Insight: {OT [facility] Insight:26272}   Judgement: {OT [facility] Judgement:26272}   Individualization: *** was *** in their participation of group discussion/activity. *** identified  Modes of Intervention: {[facility] MODES OF INTERVENTION:26273}  Patient Response to Interventions:  {[facility] OT Patient Response to Interventions:26274}   Plan: Continue to engage patient in OT groups 2 - 3x/week.  05/24/2023  Dierdra Salameh G Hayden Mabin, OT   

## 2023-05-24 NOTE — ED Notes (Signed)
Pt still sleeping, will give scheduled meds when he awakens.

## 2023-05-24 NOTE — [facility] Group Notes (Signed)
Spiritual care group on loss and grief facilitated by Chaplain Dyanne Carrel, [facility]  Group goal: Support / education around grief.  Identifying grief patterns, feelings / responses to grief, identifying behaviors that may emerge from grief responses, identifying when one may call on an ally or coping skill.  Group Description:  Following introductions and group rules, group opened with psycho-social ed. Group members engaged in facilitated dialog around topic of loss, with particular support around experiences of loss in their lives. Group Identified types of loss (relationships / self / things) and identified patterns, circumstances, and changes that precipitate losses. Reflected on thoughts / feelings around loss, normalized grief responses, and recognized variety in grief experience.  Group engaged in visual explorer activity, identifying elements of grief journey as well as needs / ways of caring for themselves. Group reflected on Worden's tasks of grief.  Group facilitation drew on brief cognitive behavioral, narrative, and Adlerian modalities  Patient progress: [name] [name] attended the last part of group, but actively engaged as soon as he arrived.  His comments were on topic and appropriate and he was supportive of others in group.

## 2023-05-24 NOTE — Progress Notes (Signed)
TOC consulted for substance abuse resources. Resources attached to AVS. Pt is under TTS care at this time. No further TOC needs.

## 2023-05-25 DIAGNOSIS — F1020 Alcohol dependence, uncomplicated: Principal | ICD-10-CM | POA: Insufficient documentation

## 2023-05-25 MED ORDER — HYDROXYZINE HCL 25 MG PO TABS: 25.0000 mg | ORAL_TABLET | Freq: Three times a day (TID) | ORAL | Status: DC | PRN

## 2023-05-25 MED ORDER — NICOTINE POLACRILEX 2 MG MT GUM
2.0000 mg | CHEWING_GUM | OROMUCOSAL | Status: DC | PRN
  Administered 2023-05-25 – 2023-05-27 (×6): 2 mg via ORAL
  Filled 2023-05-25 (×2): qty 1

## 2023-05-25 MED ORDER — TRAZODONE HCL 50 MG PO TABS: 50.0000 mg | ORAL_TABLET | Freq: Every evening | ORAL | Status: DC | PRN

## 2023-05-25 NOTE — [facility] Suicide Risk Assessment (Signed)
[facility] Admission Suicide Risk Assessment   Nursing information obtained from:  Patient Demographic factors:  Male, Adolescent or young adult Current Mental Status:  Self-harm thoughts Loss Factors:  Financial problems / change in socioeconomic status Historical Factors:  Prior suicide attempts Risk Reduction Factors:  Positive social support  Total Time spent with patient: 1 hour Principal Problem: <principal problem not specified> Diagnosis:  Active Problems:   Mood disorder in conditions classified elsewhere   Alcohol use with alcohol-induced disorder (HCC)  Subjective Data: See H&P  Continued Clinical Symptoms:    The "Alcohol Use Disorders Identification Test", Guidelines for Use in Primary Care, Second Edition.  World Science writer [facility]). Score between 0-7:  no or low risk or alcohol related problems. Score between 8-15:  moderate risk of alcohol related problems. Score between 16-19:  high risk of alcohol related problems. Score 20 or above:  warrants further diagnostic evaluation for alcohol dependence and treatment.   CLINICAL FACTORS:   Depression:   Hopelessness Impulsivity Alcohol/Substance Abuse/Dependencies   Musculoskeletal: Strength & Muscle Tone: within normal limits Gait & Station: normal Patient leans: N/A  Psychiatric Specialty Exam:  Presentation  General Appearance:  Disheveled; Casual  Eye Contact: Good  Speech: Clear and Coherent; Pressured  Speech Volume: Normal  Handedness: Right   Mood and Affect  Mood: Angry; Anxious; Irritable  Affect: Congruent; Tearful   Thought Process  Thought Processes: Coherent; Goal Directed; Linear  Descriptions of Associations:Circumstantial  Orientation:No data recorded Thought Content:Logical; Illogical; Scattered  History of Schizophrenia/Schizoaffective disorder:No data recorded Duration of Psychotic Symptoms:No data recorded Hallucinations:No data recorded Ideas of  Reference:None  Suicidal Thoughts:No data recorded Homicidal Thoughts:No data recorded  Sensorium  Memory: Immediate Good; Recent Good; Remote Fair  Judgment: Impaired  Insight: Lacking   Executive Functions  Concentration: Poor  Attention Span: Poor  Recall:No data recorded Fund of Knowledge: Fair  Language: Good   Psychomotor Activity  Psychomotor Activity:No data recorded  Assets  Assets: Communication Skills; Intimacy; Housing; Desire for Improvement; Physical Health   Sleep  Sleep:No data recorded   Physical Exam: Physical Exam ROS Blood pressure 122/71, pulse 62, temperature 98.3 F (36.8 C), temperature source Oral, resp. rate 16, height 6' (1.829 m), weight 80.7 kg, SpO2 100 %. Body mass index is 24.14 kg/m.   COGNITIVE FEATURES THAT CONTRIBUTE TO RISK:  None    SUICIDE RISK:   Moderate:  Frequent suicidal ideation with limited intensity, and duration, some specificity in terms of plans, no associated intent, good self-control, limited dysphoria/symptomatology, some risk factors present, and identifiable protective factors, including available and accessible social support.  PLAN OF CARE: See H&P  I certify that inpatient services furnished can reasonably be expected to improve the patient's condition.   Jaydon Soroka Abbott Pao, MD 05/25/2023, 1:27 PM

## 2023-05-25 NOTE — Group Note (Signed)
Recreation Therapy Group Note   Group Topic:Animal Assisted Therapy   Group Date: 05/25/2023 Start Time: 0945 End Time: 1030 Facilitators: Enez Monahan-McCall, LRT,CTRS Location: [address] Day[address]   Animal-Assisted Activity (AAA) Program Checklist/Progress Notes Patient Eligibility Criteria Checklist & Daily Group note for Rec Tx Intervention  AAA/T Program Assumption of Risk Form signed by Patient/ or Parent Legal Guardian Yes  Patient is free of allergies or severe asthma Yes  Patient reports no fear of animals Yes  Patient reports no history of cruelty to animals Yes  Patient understands his/her participation is voluntary Yes  Patient washes hands before animal contact Yes  Patient washes hands after animal contact Yes   Affect/Mood: Appropriate   Participation Level: Engaged   Participation Quality: Independent   Behavior: Appropriate   Speech/Thought Process: Focused    Clinical Observations/Individualized Feedback:     Plan: Continue to engage patient in RT group sessions 2-3x/week.   [name] [name], LRT,CTRS 05/25/2023 1:06 PM

## 2023-05-25 NOTE — B[facility] Group Notes (Signed)
Adult Psychoeducational Group Note  Date:  05/25/2023 Time:  10:17 AM  Group Topic/Focus:  Goals Group:   The focus of this group is to help patients establish daily goals to achieve during treatment and discuss how the patient can incorporate goal setting into their daily lives to aide in recovery. Orientation:   The focus of this group is to educate the patient on the purpose and policies of crisis stabilization and provide a format to answer questions about their admission.  The group details unit policies and expectations of patients while admitted.  Participation Level:  Active  Participation Quality:  Appropriate  Affect:  Appropriate  Cognitive:  Appropriate  Insight: Good  Engagement in Group:  Engaged  Modes of Intervention:  Discussion, Education, and Exploration  Additional Comments:  Pt participated in group today. Pt identified his goal is to watch what he says when getting upset, stating to think before I speak. Pt actively listened and participated in group discussion.   [name] 05/25/2023, 10:17 AM

## 2023-05-25 NOTE — Progress Notes (Signed)
   05/24/23 2100  Psych Admission Type (Psych Patients Only)  Admission Status Involuntary  Psychosocial Assessment  Patient Complaints None  Eye Contact Brief  Facial Expression Animated  Affect Blunted  Speech Logical/coherent  Interaction Assertive  Motor Activity Fidgety  Appearance/Hygiene In scrubs  Behavior Characteristics Cooperative  Mood Pleasant  Thought Process  Coherency WDL  Content WDL  Delusions None reported or observed  Perception WDL  Hallucination None reported or observed  Judgment WDL  Confusion None  Danger to Self  Current suicidal ideation? Denies  Danger to Others  Danger to Others None reported or observed
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
## 2023-05-25 NOTE — Progress Notes (Signed)
D: Patient is alert, oriented, pleasant, and cooperative. Denies SI, HI, AVH, and verbally contracts for safety. Patient reports he slept good last night without sleeping medication. Patient reports his appetite as good, energy level as normal, and concentration as good. Patient rates his depression 0/10, hopelessness 0/10, and anxiety 0/10. Patient denies physical symptoms/pain.    A: Scheduled medications administered per MD order. Support provided. Patient educated on safety on the unit and medications. Routine safety checks every 15 minutes. Patient stated understanding to tell nurse about any new physical symptoms. Patient understands to tell staff of any needs.     R: No adverse drug reactions noted. Patient verbally contracts for safety. Patient remains safe at this time and will continue to monitor.    05/25/23 1500  Psych Admission Type (Psych Patients Only)  Admission Status Involuntary  Psychosocial Assessment  Patient Complaints None  Eye Contact Fair  Facial Expression Animated  Affect Appropriate to circumstance  Speech Logical/coherent  Interaction Assertive  Motor Activity Fidgety  Appearance/Hygiene Unremarkable  Behavior Characteristics Cooperative  Mood Pleasant  Thought Process  Coherency WDL  Content WDL  Delusions None reported or observed  Perception WDL  Hallucination None reported or observed  Judgment Impaired  Confusion None  Danger to Self  Current suicidal ideation? Denies  Danger to Others  Danger to Others None reported or observed

## 2023-05-25 NOTE — [facility] Group Notes (Signed)
Adult Psychoeducational Group Note  Date:  05/25/2023 Time:  8:42 PM  Group Topic/Focus:  Wrap-Up Group:   The focus of this group is to help patients review their daily goal of treatment and discuss progress on daily workbooks.  Participation Level:  Active  Participation Quality:  Attentive  Affect:  Appropriate  Cognitive:  Appropriate  Insight: Appropriate  Engagement in Group:  Engaged  Modes of Intervention:  Discussion  Additional Comments:  Patient attended and participated in the Wrap-up group.  Jearl Klinefelter 05/25/2023, 8:42 PM

## 2023-05-25 NOTE — [facility] Counselor (Signed)
Adult Comprehensive Assessment  Patient ID: [name] [name], male   DOB: 26-Jun-1996, 26 y.o.   MRN: [id number]  Information Source: Information source: Patient  Current Stressors:  Patient states their primary concerns and needs for treatment are:: "to get home to my girlfriend that is pregnant with twins.  She is due anyday now." Patient states their goals for this hospitilization and ongoing recovery are:: "to get stable and go home." Educational / Learning stressors: denies Employment / Job issues: "I don't want to lose my job being here. I make good money." Family Relationships: reports good Surveyor, quantity / Lack of resources (include bankruptcy): denies Housing / Lack of housing: denies Physical health (include injuries & life threatening diseases): denies Social relationships: denies Substance abuse: denies Bereavement / Loss: denies  Living/Environment/Situation:  Living Arrangements: Parent, Spouse/significant other Who else lives in the home?: parents, girlfriend, girlfriend's 91 yr old How long has patient lived in current situation?: few months What is atmosphere in current home: Comfortable  Family History:  Marital status: Single Are you sexually active?: Yes What is your sexual orientation?: heterosexual Has your sexual activity been affected by drugs, alcohol, medication, or emotional stress?: denies Does patient have children?: No  Childhood History:  By whom was/is the patient raised?: Both parents Description of patient's relationship with caregiver when they were a child: denies any major concerns, reports that his father talked down to him Patient's description of current relationship with people who raised him/her: reports good relationships with both parents How were you disciplined when you got in trouble as a child/adolescent?: "talked down on" Did patient suffer any verbal/emotional/physical/sexual abuse as a child?: No Did patient suffer from severe childhood  neglect?: No Has patient ever been sexually abused/assaulted/raped as an adolescent or adult?: No Was the patient ever a victim of a crime or a disaster?: No Witnessed domestic violence?: No Has patient been affected by domestic violence as an adult?: No  Education:  Currently a Consulting civil engineer?: No Learning disability?: No  Employment/Work Situation:   Employment Situation: Employed 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?: No  Financial Resources:   Financial resources: Income from employment Does patient have a representative payee or guardian?: No  Alcohol/Substance Abuse:   What has been your use of drugs/alcohol within the last 12 months?: denies If attempted suicide, did drugs/alcohol play a role in this?: No Alcohol/Substance Abuse Treatment Hx: Denies past history Has alcohol/substance abuse ever caused legal problems?: No  Social Support System:   Conservation officer, nature Support System: Production assistant, radio System: parents, girlfriend  Leisure/Recreation:   Do You Have Hobbies?: Yes Leisure and Hobbies: video games, engaging in activities with girlfriend  Strengths/Needs:   What is the patient's perception of their strengths?: "I  can play video games instead of arguing with my girlfriend." Patient states they can use these personal strengths during their treatment to contribute to their recovery: "I don't know." Patient states these barriers may affect/interfere with their treatment: "being here" Patient states these barriers may affect their return to the community: "my girlfriend having the twins and I am stuck here."  Discharge Plan:   Currently  receiving community mental health services: No Patient states  concerns and preferences for aftercare planning are: "MH appts." Patient states they will know when they are safe and ready for discharge when: "I am ready.  I made some stupid mistakes." Does patient have access to transportation?: Yes Does patient have financial barriers related to discharge medications?: Yes Patient description of barriers related to discharge medications: no insurance Will patient be returning to same living situation after discharge?: Yes  Summary/Recommendations:   Summary and Recommendations (to be completed by the evaluator): Jaskirat is a 27 year old male that was admitted to [facility] on 05/24/23.  He reports that this is his first admission.  He shared that while in an argument with his girlfriend he said some things that he should not have.  He reports that his girlfriend is 8 months pregnant with twins, due anyday now. He lives with his parents and works full time at an apartment complex in the maintenance dept. He is open to MH follow up appts. While here, Darvon can benefit from crisis stabilization, medication management, therapeutic milieu, and referrals for services.    Marinda Elk. 05/25/2023

## 2023-05-25 NOTE — Group Note (Signed)
[facility] LCSW Group Therapy Note   Group Date: 05/25/2023 Start Time: 1100 End Time: 1200   Type of Therapy and Topic: Group Therapy: Avoiding Self-Sabotaging and Enabling Behaviors  Participation Level: Active  Mood:Euthymic  Description of Group:  In this group, patients will learn how to identify obstacles, self-sabotaging and enabling behaviors, as well as: what are they, why do we do them and what needs these behaviors meet. Discuss unhealthy relationships and how to have positive healthy boundaries with those that sabotage and enable. Explore aspects of self-sabotage and enabling in yourself and how to limit these self-destructive behaviors in everyday life.   Therapeutic Goals: 1. Patient will identify one obstacle that relates to self-sabotage and enabling behaviors 2. Patient will identify one personal self-sabotaging or enabling behavior they did prior to admission 3. Patient will state a plan to change the above identified behavior 4. Patient will demonstrate ability to communicate their needs through discussion and/or role play.      Therapeutic Modalities:  Cognitive Behavioral Therapy Person-Centered Therapy Motivational Interviewing    [name] [name] M [name] Widmann, LCSW 

## 2023-05-25 NOTE — Progress Notes (Signed)
   05/25/23 0645  15 Minute Checks  Location Hallway  Visual Appearance Calm  Behavior Composed  Sleep (Behavioral Health Patients Only)  Calculate sleep? (Click Yes once per 24 hr at 0600 safety check) Yes  Documented sleep last 24 hours 7.75

## 2023-05-26 ENCOUNTER — Encounter (HOSPITAL_COMMUNITY): Payer: Self-pay

## 2023-05-26 LAB — TSH: TSH: 3.44 u[IU]/mL (ref 0.350–4.500)

## 2023-05-26 NOTE — B[facility] Group Notes (Signed)
Spirituality group facilitated by Kathleen Argue, BCC.  Group Description: Group focused on topic of hope. Patients participated in facilitated discussion around topic, connecting with one another around experiences and definitions for hope. Group members engaged with visual explorer photos, reflecting on what hope looks like for them today. Group engaged in discussion around how their definitions of hope are present today in hospital.  Modalities: Psycho-social ed, Adlerian, Narrative, MI  Patient Progress: [name] [name] attended group and actively engaged and participated in group conversation and activities. The babies that he and his girlfriend are expecting are giving him hope and he feels that if he can remember what is important, that he can stay sober and get his life on track.  His comments demonstrated good insight.
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
## 2023-05-26 NOTE — [facility] Suicide Risk Assessment (Signed)
[facility] INPATIENT:  Family/Significant Other Suicide Prevention Education  Suicide Prevention Education:  Education Completed; Bary Leriche [phone],  (girlfriend) has been identified by the patient as the family member/significant other with whom the patient will be residing, and identified as the person(s) who will aid the patient in the event of a mental health crisis (suicidal ideations/suicide attempt).  With written consent from the patient, the family member/significant other has been provided the following suicide prevention education, prior to the and/or following the discharge of the patient.  The suicide prevention education provided includes the following: Suicide risk factors Suicide prevention and interventions National Suicide Hotline telephone number [facility] assessment telephone number [facility] Emergency Assistance 911 [facility] and/or Residential Mobile Crisis Unit telephone number  Request made of family/significant other to: Remove weapons (e.g., guns, rifles, knives), all items previously/currently identified as safety concern.   Remove drugs/medications (over-the-counter, prescriptions, illicit drugs), all items previously/currently identified as a safety concern.  The family member/significant other verbalizes understanding of the suicide prevention education information provided.  The family member/significant other agrees to remove the items of safety concern listed above.  Jodelle Gross Custer Pimenta 05/26/2023, 2:32 PM

## 2023-05-26 NOTE — Group Note (Signed)
Date:  05/26/2023 Time:  10:34 PM  Group Topic/Focus:  NA    Participation Level:  Active  Participation Quality:  Appropriate  Affect:  Appropriate  Cognitive:  Appropriate  Insight: Appropriate  Engagement in Group:  Engaged  Modes of Intervention:  Education  Additional Comments:  Patient attended NA  Scot Dock 05/26/2023, 10:34 PM

## 2023-05-26 NOTE — BHH Group Notes (Signed)
Adult Psychoeducational Group Note  Date:  05/26/2023 Time:  4:44 PM  Group Topic/Focus:  Dimensions of Wellness:   The focus of this group is to introduce the topic of wellness and discuss the role each dimension of wellness plays in total health.  Participation Level:  Active  Participation Quality:  Appropriate  Affect:  Appropriate  Cognitive:  Alert  Insight: Good  Engagement in Group:  Engaged  Modes of Intervention:  Education and Socialization  Additional Comments:  Pt participated in group. Pt listened as facilitator explained Social Wellness and the eight dimensions of wellness. Pt participated in group activity of identifying at least 3 positive social relationships in his life.     [name] [name] 05/26/2023, 4:44 PM
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
## 2023-05-26 NOTE — Progress Notes (Signed)
Pt rates depression 0/10 and anxiety 0/10. Pt shares he feels good today. Pt shares he was initially mad about being here but now is feeling better about this stay as he "want to stay sober" and has 2 daughters on the way, pt shares he is financially ready and has set up nursery, recognizes he needs to be "mentally ready". Pt reports a good appetite, and no physical problems. Pt denies SI/HI/AVH and verbally contracts for safety. Provided support and encouragement. Pt safe on the unit. Q 15 minute safety checks continued.

## 2023-05-26 NOTE — Group Note (Unsigned)
Date:  05/26/2023 Time:  10:07 PM  Group Topic/Focus:  Wrap-Up Group:   The focus of this group is to help patients review their daily goal of treatment and discuss progress on daily workbooks.     Participation Level:  {BHH PARTICIPATION LEVEL:22264}  Participation Quality:  {BHH PARTICIPATION QUALITY:22265}  Affect:  {BHH AFFECT:22266}  Cognitive:  {BHH COGNITIVE:22267}  Insight: {BHH Insight2:20797}  Engagement in Group:  {BHH ENGAGEMENT IN GROUP:22268}  Modes of Intervention:  {BHH MODES OF INTERVENTION:22269}  Additional Comments:  ***  Praise Stennett Dacosta 05/26/2023, 10:07 PM  

## 2023-05-26 NOTE — Group Note (Signed)
Recreation Therapy Group Note   Group Topic:Communication  Group Date: 05/26/2023 Start Time: 0935 End Time: 1000 Facilitators: Alishba Naples-McCall, LRT,CTRS Location: [address] Dayroom   Goal Area(s) Addresses:  Patient will effectively listen to complete activity.  Patient will identify communication skills used to make activity successful.  Patient will identify how skills used during activity can be used to reach post d/c goals.    Group Descripiton: Geometric Drawings.  Three volunteers from the peer group will be shown an abstract picture with a particular arrangement of geometrical shapes.  Each round, one 'speaker' will describe the pattern, as accurately as possible without revealing the image to the group.  The remaining group members will listen and draw the picture to reflect how it is described to them. Patients with the role of 'listener' cannot ask clarifying questions but, may request that the speaker repeat a direction. Once the drawings are complete, the presenter will show the rest of the group the picture and compare how close each person came to drawing the picture. LRT will facilitate a post-activity discussion regarding effective communication and the importance of planning, listening, and asking for clarification in daily interactions with others.   Affect/Mood: N/A   Participation Level: Did not attend    Clinical Observations/Individualized Feedback:     Plan: Continue to engage patient in RT group sessions 2-3x/week.   Nickey Canedo-McCall, LRT,CTRS 05/26/2023 12:43 PM

## 2023-05-26 NOTE — BH IP Treatment Plan (Signed)
Interdisciplinary Treatment and Diagnostic Plan Update  05/26/2023 Time of Session: 10:50 AM  [name] MRN: [id number]  Principal Diagnosis: Alcohol use disorder, severe, dependence (HCC)  Secondary Diagnoses: Principal Problem:   Alcohol use disorder, severe, dependence (HCC) Active Problems:   Mood disorder in conditions classified elsewhere   Alcohol use with alcohol-induced disorder (HCC)   Current Medications:  Current Facility-Administered Medications  Medication Dose Route Frequency Provider Last Rate Last Admin   acetaminophen (TYLENOL) tablet 650 mg  650 mg Oral Q6H PRN Dahlia Byes C, NP   650 mg at 05/26/23 0833   alum & mag hydroxide-simeth (MAALOX/MYLANTA) 200-200-20 MG/5ML suspension 30 mL  30 mL Oral Q4H PRN Dahlia Byes C, NP       diphenhydrAMINE (BENADRYL) capsule 50 mg  50 mg Oral TID PRN Earney Navy, NP       Or   diphenhydrAMINE (BENADRYL) injection 50 mg  50 mg Intramuscular TID PRN Earney Navy, NP       folic acid (FOLVITE) tablet 1 mg  1 mg Oral Daily Dahlia Byes C, NP   1 mg at 05/26/23 4098   haloperidol (HALDOL) tablet 5 mg  5 mg Oral TID PRN Earney Navy, NP       Or   haloperidol lactate (HALDOL) injection 5 mg  5 mg Intramuscular TID PRN Earney Navy, NP       hydrOXYzine (ATARAX) tablet 25 mg  25 mg Oral TID PRN Abbott Pao, Nadir, MD       LORazepam (ATIVAN) tablet 2 mg  2 mg Oral TID PRN Dahlia Byes C, NP       Or   LORazepam (ATIVAN) injection 2 mg  2 mg Intramuscular TID PRN Dahlia Byes C, NP       LORazepam (ATIVAN) tablet 1 mg  1 mg Oral Q6H PRN Massengill, Nathan, MD       magnesium hydroxide (MILK OF MAGNESIA) suspension 30 mL  30 mL Oral Daily PRN Dahlia Byes C, NP       multivitamin with minerals tablet 1 tablet  1 tablet Oral Daily Dahlia Byes C, NP   1 tablet at 05/26/23 1191   nicotine polacrilex (NICORETTE) gum 2 mg  2 mg Oral PRN Phineas Inches, MD   2 mg at  05/26/23 1158   thiamine (Vitamin B-1) tablet 100 mg  100 mg Oral Daily Onuoha, Josephine C, NP   100 mg at 05/26/23 4782   traZODone (DESYREL) tablet 50 mg  50 mg Oral QHS PRN Sarita Bottom, MD       PTA Medications: No medications prior to admission.    Patient Stressors: Financial difficulties   Health problems   Marital or family conflict    Patient Strengths: Ability for insight  Capable of independent living  Communication skills  Supportive family/friends   Treatment Modalities: Medication Management, Group therapy, Case management,  1 to 1 session with clinician, Psychoeducation, Recreational therapy.   Physician Treatment Plan for Primary Diagnosis: Alcohol use disorder, severe, dependence (HCC) Long Term Goal(s): Improvement in symptoms so as ready for discharge   Short Term Goals: Ability to identify changes in lifestyle to reduce recurrence of condition will improve Ability to verbalize feelings will improve Ability to disclose and discuss suicidal ideas Ability to demonstrate self-control will improve  Medication Management: Evaluate patient's response, side effects, and tolerance of medication regimen.  Therapeutic Interventions: 1 to 1 sessions, Unit Group sessions and Medication administration.  Evaluation of Outcomes: Not  Progressing  Physician Treatment Plan for Secondary Diagnosis: Principal Problem:   Alcohol use disorder, severe, dependence (HCC) Active Problems:   Mood disorder in conditions classified elsewhere   Alcohol use with alcohol-induced disorder (HCC)  Long Term Goal(s): Improvement in symptoms so as ready for discharge   Short Term Goals: Ability to identify changes in lifestyle to reduce recurrence of condition will improve Ability to verbalize feelings will improve Ability to disclose and discuss suicidal ideas Ability to demonstrate self-control will improve     Medication Management: Evaluate patient's response, side effects, and  tolerance of medication regimen.  Therapeutic Interventions: 1 to 1 sessions, Unit Group sessions and Medication administration.  Evaluation of Outcomes: Not Progressing   RN Treatment Plan for Primary Diagnosis: Alcohol use disorder, severe, dependence (HCC) Long Term Goal(s): Knowledge of disease and therapeutic regimen to maintain health will improve  Short Term Goals: Ability to remain free from injury will improve, Ability to verbalize frustration and anger appropriately will improve, Ability to demonstrate self-control, Ability to participate in decision making will improve, Ability to verbalize feelings will improve, Ability to disclose and discuss suicidal ideas, Ability to identify and develop effective coping behaviors will improve, and Compliance with prescribed medications will improve  Medication Management: RN will administer medications as ordered by provider, will assess and evaluate patient's response and provide education to patient for prescribed medication. RN will report any adverse and/or side effects to prescribing provider.  Therapeutic Interventions: 1 on 1 counseling sessions, Psychoeducation, Medication administration, Evaluate responses to treatment, Monitor vital signs and CBGs as ordered, Perform/monitor CIWA, COWS, AIMS and Fall Risk screenings as ordered, Perform wound care treatments as ordered.  Evaluation of Outcomes: Not Progressing   LCSW Treatment Plan for Primary Diagnosis: Alcohol use disorder, severe, dependence (HCC) Long Term Goal(s): Safe transition to appropriate next level of care at discharge, Engage patient in therapeutic group addressing interpersonal concerns.  Short Term Goals: Engage patient in aftercare planning with referrals and resources, Increase social support, Increase ability to appropriately verbalize feelings, Increase emotional regulation, Facilitate acceptance of mental health diagnosis and concerns, Facilitate patient progression  through stages of change regarding substance use diagnoses and concerns, Identify triggers associated with mental health/substance abuse issues, and Increase skills for wellness and recovery  Therapeutic Interventions: Assess for all discharge needs, 1 to 1 time with Social worker, Explore available resources and support systems, Assess for adequacy in community support network, Educate family and significant other(s) on suicide prevention, Complete Psychosocial Assessment, Interpersonal group therapy.  Evaluation of Outcomes: Not Progressing   Progress in Treatment: Attending groups: Yes. Participating in groups: Yes. Taking medication as prescribed: Yes. Toleration medication: Yes. Family/Significant other contact made: Yes, individual(s) contacted:  Bary Leriche [phone] Patient understands diagnosis: Yes. Discussing patient identified problems/goals with staff: Yes. Medical problems stabilized or resolved: Yes. Denies suicidal/homicidal ideation: Yes. Issues/concerns per patient self-inventory: No.   New problem(s) identified: No, Describe:  None reported   New Short Term/Long Term Goal(s):detox, medication management for mood stabilization; elimination of SI thoughts; development of comprehensive mental wellness/sobriety plan   Patient Goals:  " cope with stress , stay sober, and get some counseling "   Discharge Plan or Barriers: Patient recently admitted. CSW will continue to follow and assess for appropriate referrals and possible discharge planning.    Reason for Continuation of Hospitalization: Anxiety Depression Medication stabilization Suicidal ideation Withdrawal symptoms  Estimated Length of Stay: 3-5 days   Last 3 [place] Suicide Severity Risk Score: Flowsheet Row  [facility] from [facility]/05/2021 in [facility]  Urgent Care at [facility] RISK CATEGORY No Risk No Risk No Risk       Last PHQ 2/9 Scores:     No data to display          Scribe for Treatment Team: Beather Arbour 05/26/2023 3:15 PM

## 2023-05-26 NOTE — Progress Notes (Signed)
Pt denied SI/HI/AVH this morning. Pt has been pleasant, calm, and cooperative throughout the shift. Pt complained of sore throat and headache this morning, PRN Tylenol administered with good effect. RN provided support and encouragement to patient. Pt given scheduled medications as prescribed. Q15 min checks verified for safety. Patient verbally contracts for safety. Patient compliant with medications and treatment plan. Patient is interacting well on the unit. Pt is safe on the unit.   05/26/23 0941  Psych Admission Type (Psych Patients Only)  Admission Status Involuntary  Psychosocial Assessment  Patient Complaints None  Eye Contact Fair  Facial Expression Animated  Affect Appropriate to circumstance  Speech Logical/coherent  Interaction Assertive  Motor Activity Fidgety  Appearance/Hygiene Unremarkable  Behavior Characteristics Cooperative;Appropriate to situation  Mood Pleasant  Thought Process  Coherency WDL  Content WDL  Delusions None reported or observed  Perception WDL  Hallucination None reported or observed  Judgment Impaired  Confusion None  Danger to Self  Current suicidal ideation? Denies  Danger to Others  Danger to Others None reported or observed

## 2023-05-26 NOTE — Plan of Care (Signed)
  Problem: Education: Goal: Emotional status will improve Outcome: Progressing Goal: Mental status will improve Outcome: Progressing   

## 2023-05-26 NOTE — B[facility] Group Notes (Signed)
Adult Psychoeducational Group Note  Date:  05/26/2023 Time:  9:54 PM  Group Topic/Focus:  Wrap-Up Group:   The focus of this group is to help patients review their daily goal of treatment and discuss progress on daily workbooks.  Participation Level:  None  Participation Quality:  Appropriate  Affect:  Appropriate  Cognitive:  Appropriate  Insight: None  Engagement in Group:  None  Modes of Intervention:  Discussion  Additional Comments:   PT did not  attend group.  Joselyn Arrow 05/26/2023, 9:54 PM

## 2023-05-27 NOTE — Group Note (Signed)
Date:  05/28/2023 Time:  12:28 AM  Group Topic/Focus:  Wrap-Up Group:   The focus of this group is to help patients review their daily goal of treatment and discuss progress on daily workbooks.    Participation Level:  Active  Participation Quality:  Appropriate  Affect:  Appropriate  Cognitive:  Appropriate  Insight: Appropriate  Engagement in Group:  Engaged  Modes of Intervention:  Discussion  Additional Comments:  patient attend wrap up group  Charna Busman Long 05/28/2023, 12:28 AM
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
## 2023-05-27 NOTE — Progress Notes (Signed)
   05/27/23 1000  Psych Admission Type (Psych Patients Only)  Admission Status Involuntary  Psychosocial Assessment  Patient Complaints None  Eye Contact Fair  Facial Expression Animated  Affect Appropriate to circumstance  Speech Logical/coherent  Interaction Assertive  Motor Activity Fidgety  Appearance/Hygiene Unremarkable  Behavior Characteristics Cooperative;Appropriate to situation  Mood Pleasant  Thought Process  Coherency WDL  Content WDL  Delusions None reported or observed  Perception WDL  Hallucination None reported or observed  Judgment Impaired  Confusion None  Danger to Self  Current suicidal ideation? Denies  Danger to Others  Danger to Others None reported or observed

## 2023-05-27 NOTE — Group Note (Deleted)
Date:  05/27/2023 Time:  10:25 PM  Group Topic/Focus:  Wrap-Up Group:   The focus of this group is to help patients review their daily goal of treatment and discuss progress on daily workbooks.     Participation Level:  {BHH PARTICIPATION LEVEL:22264}  Participation Quality:  {BHH PARTICIPATION QUALITY:22265}  Affect:  {BHH AFFECT:22266}  Cognitive:  {BHH COGNITIVE:22267}  Insight: {BHH Insight2:20797}  Engagement in Group:  {BHH ENGAGEMENT IN GROUP:22268}  Modes of Intervention:  {BHH MODES OF INTERVENTION:22269}  Additional Comments:  ***  [name] [name] 05/27/2023, 10:25 PM  

## 2023-05-27 NOTE — Group Note (Deleted)
Date:  05/27/2023 Time:  10:38 PM  Group Topic/Focus:  Wrap-Up Group:   The focus of this group is to help patients review their daily goal of treatment and discuss progress on daily workbooks.     Participation Level:  {BHH PARTICIPATION LEVEL:22264}  Participation Quality:  {BHH PARTICIPATION QUALITY:22265}  Affect:  {BHH AFFECT:22266}  Cognitive:  {BHH COGNITIVE:22267}  Insight: {BHH Insight2:20797}  Engagement in Group:  {BHH ENGAGEMENT IN GROUP:22268}  Modes of Intervention:  {BHH MODES OF INTERVENTION:22269}  Additional Comments:  ***  [name] [name] 05/27/2023, 10:38 PM  

## 2023-05-27 NOTE — Group Note (Signed)
Date:  05/27/2023 Time:  12:34 PM  BHH LCSW Group Therapy Note   Group Date: @GROUPDATE@ Start Time: @GROUPSTARTTIME@ End Time: @GROUPENDTIME@   Type of Therapy/Topic:  Group Therapy:  Emotion Regulation  Participation Level:    Mood:  Description of Group:    The purpose of this group is to assist patients in learning to regulate negative emotions and experience positive emotions. Patients will be guided to discuss ways in which they have been vulnerable to their negative emotions. These vulnerabilities will be juxtaposed with experiences of positive emotions or situations, and patients challenged to use positive emotions to combat negative ones. Special emphasis will be placed on coping with negative emotions in conflict situations, and patients will process healthy conflict resolution skills.  Therapeutic Goals: Patient will identify two positive emotions or experiences to reflect on in order to balance out negative emotions:  Patient will label two or more emotions that they find the most difficult to experience:  Patient will be able to demonstrate positive conflict resolution skills through discussion or role plays:   Summary of Patient Progress:       Therapeutic Modalities:   Cognitive Behavioral Therapy Feelings Identification Dialectical Behavioral Therapy   Lindzey Zent M ChriscoGroup Topic/Focus:  Managing Feelings:   The focus of this group is to identify what feelings patients have difficulty handling and develop a plan to handle them in a healthier way upon discharge.    Participation Level:  Active  Participation Quality:  Appropriate  Affect:  Appropriate  Cognitive:  Appropriate  Insight: Appropriate  Engagement in Group:  Engaged  Modes of Intervention:  Clarification, Discussion, and Rapport Building  Additional Comments:    Blakelynn Scheeler M Khia Dieterich 05/27/2023, 12:34 PM  

## 2023-05-27 NOTE — Progress Notes (Signed)
   05/27/23 2300  Psych Admission Type (Psych Patients Only)  Admission Status Involuntary  Psychosocial Assessment  Patient Complaints None  Eye Contact Fair  Facial Expression Animated  Affect Appropriate to circumstance  Speech Logical/coherent  Interaction Assertive  Motor Activity Other (Comment) (wdl)  Appearance/Hygiene Unremarkable  Behavior Characteristics Cooperative;Appropriate to situation  Mood Pleasant  Thought Process  Coherency WDL  Content WDL  Delusions None reported or observed  Perception WDL  Hallucination None reported or observed  Judgment Impaired  Confusion None  Danger to Self  Current suicidal ideation? Denies (denies)  Danger to Others  Danger to Others None reported or observed   D: Pt alert and oriented. Pt rates depression 0/10 and anxiety 0/10.  Pt denies experiencing any SI/HI, or AVH at this time.   A: Support and encouragement provided. Frequent verbal contact made. Routine safety checks conducted q15 minutes.   R: No adverse drug reactions noted. Pt verbally contracts for safety at this time. Pt complaint with medications and treatment plan. Pt interacts well with others on the unit. Pt remains safe at this time. Will continue to monitor.

## 2023-05-27 NOTE — Group Note (Deleted)
Date:  05/27/2023 Time:  10:36 PM  Group Topic/Focus:  Wrap-Up Group:   The focus of this group is to help patients review their daily goal of treatment and discuss progress on daily workbooks.     Participation Level:  {BHH PARTICIPATION LEVEL:22264}  Participation Quality:  {BHH PARTICIPATION QUALITY:22265}  Affect:  {BHH AFFECT:22266}  Cognitive:  {BHH COGNITIVE:22267}  Insight: {BHH Insight2:20797}  Engagement in Group:  {BHH ENGAGEMENT IN GROUP:22268}  Modes of Intervention:  {BHH MODES OF INTERVENTION:22269}  Additional Comments:  ***  [name] [name] 05/27/2023, 10:36 PM  

## 2023-05-28 DIAGNOSIS — F1020 Alcohol dependence, uncomplicated: Secondary | ICD-10-CM

## 2023-05-28 NOTE — Progress Notes (Signed)
  [facility] Adult Case Management Discharge Plan :  Will you be returning to the same living situation after discharge:  Yes,  patient will be returning back to his home with girlfriend  At discharge, do you have transportation home?: Yes,  Girlfriend will pick patient up at 11AM Do you have the ability to pay for your medications: Yes,  [place] Medicaid prepaid Health Plan [employer]   Release of information consent forms completed and in the chart;  Patient's signature needed at discharge.  Patient to Follow up at:  Follow-up Information     [facility]. Go on 05/31/2023.   Specialty: Behavioral Health Why: Please go to this provider for therapy and medication management services. For faster service, go on Monday, by 7:00 am because currently there appointments are backed up until September. Contact information: [address] [place] [address] [phone]                Next level of care provider has access to [facility] Link:yes  Safety Planning and Suicide Prevention discussed: Jens Som [phone]     Has patient been referred to the Quitline?: Patient refused referral for treatment  Patient has been referred for addiction treatment: Yes, referral information given but appointment not made For [facility] services because they have reach capacity, per Alene Mires (list facility).  Isabella Bowens, LCSWA 05/28/2023, 9:53 AM

## 2023-05-28 NOTE — BHH Suicide Risk Assessment (Signed)
[facility] Discharge Suicide Risk Assessment   Principal Problem: Alcohol use disorder, severe, dependence (HCC) Discharge Diagnoses: Principal Problem:   Alcohol use disorder, severe, dependence (HCC) Active Problems:   Mood disorder in conditions classified elsewhere   Alcohol use with alcohol-induced disorder (HCC)   Total Time spent with patient: 45 minutes  Reason for admission: [name] [name] is a 27 y.o., male with a past psychiatric history significant for polysubstance use including alcohol marijuana and cocaine, depression who presents to the [facility] from emergency room for evaluation and management of depression after suicide yesterday.  According to outside records, the patient has history of suicide attempt alcohol use, cannabis and cocaine use, presented to emergency room after girlfriend called the police after patient made suicidal statement, she showed police and they do patient sent her of him trying to hang himself.  PTA Medications:  None  Hospital Course:  Initial assessment on 7/9, patient was evaluated on the inpatient unit, the patient reports he had an argument with his girlfriend and they have not been "ugly to each other" when he gets stressed, he notes that she told him to go kill himself and hang himself, he tells me then he had a rope in his hand and send her video of him having a rope in his hands.  When I contacted the girlfriend for collateral information she tells me that he sent her a video of him having a rope around his neck and telling her that he would kill himself.  He tells me that he did not want to kill himself but "I wanted to get her" during evaluation he denies any passive or active SI intention or plan and describes his gesture as "stupid" he tells me that he has kids on the way and he needs to stop using alcohol and drugs.  He denies any feeling depressed, denies any problem with sleep or appetite or energy level or concentration.  He  denies HI or AVH he denies paranoia or other delusions.  He denies symptoms consistent with PTSD or mania or hypomania.  He reports stress related to relationship stressor with his girlfriend but describes his parents as very supportive.  He presents future oriented noting that he wishes to go home and go back to work.  During the patient's hospitalization, patient had extensive initial psychiatric evaluation, and follow-up psychiatric evaluations every day.  Psychiatric diagnoses provided upon initial assessment: Depressive disorder NOS, alcohol use disorder severe dependence, cocaine use disorder  Patient's psychiatric medications were adjusted on admission: Patient was started on Vistaril 25 mg 3 times daily as needed for anxiety and trazodone 50 mg at bedtime as needed for sleep, he was also recommended but refused to start any antidepressant  During the hospitalization, other adjustments were made to the patient's psychiatric medication regimen: Patient did not use or need any as needed medication for anxiety or sleep.  Patient's care was discussed during the interdisciplinary team meeting every day during the hospitalization.  The patient denied having side effects to prescribed psychiatric medication. All through the hospitalization patient was noted to be pleasant and bright on the unit attending groups and interacting in the milieu with no issues noted, given patient's and family's report seems like he was coping with the stressors by using alcohol and drugs he did refuse to start any antidepressant to help with mood after discharge but agreed with plan to refer to IOP for substance use rehab as well as individual counseling and couples counseling to  help with handling stressors and coping skills. All through hospitalization patient did not report or display any sign consistent with alcohol withdrawals.  He denied craving alcohol or cocaine and agreed to comply with recommendation to abstain  completely after discharge. Gradually, patient started adjusting to milieu. The patient was evaluated each day by a clinical provider to ascertain response to treatment. Improvement was noted by the patient's report of decreasing symptoms, improved sleep and appetite, affect, medication tolerance, behavior, and participation in unit programming.  Patient was asked each day to complete a self inventory noting mood, mental status, pain, new symptoms, anxiety and concerns.    Symptoms were reported as significantly decreased or resolved completely by discharge.   On day of discharge, patient was evaluated on 05/28/2023, patient presented future and goal oriented talking about wanting to go back to work and participate in individual and couples counseling to help with his relationship as well as focusing on expecting his twin given his girlfriend is due in next 4 weeks.  The patient reports that their mood is stable. The patient denied having suicidal thoughts for more than 48 hours prior to discharge.  Patient denies having homicidal thoughts.  Patient denies having auditory hallucinations.  Patient denies any visual hallucinations or other symptoms of psychosis. The patient was motivated to continue taking medication with a goal of continued improvement in mental health.   The patient reports their target psychiatric symptoms of increased stress and suicide gesture, responded well to the psychiatric medications, and the patient reports overall benefit other psychiatric hospitalization. Supportive psychotherapy was provided to the patient. The patient also participated in regular group therapy while hospitalized. Coping skills, problem solving as well as relaxation therapies were also part of the unit programming.  Labs were reviewed with the patient, and abnormal results were discussed with the patient.  The patient is able to verbalize their individual safety plan to this provider.  Behavioral Events:  None  Restraints: None  Groups: Attended and participated  Medications Changes: As above  D/C Medications: None  Musculoskeletal: Strength & Muscle Tone: within normal limits Gait & Station: normal Patient leans: N/A  Psychiatric Specialty Exam  General Appearance: appears at stated age, fairly dressed and groomed  Behavior: pleasant and cooperative  Psychomotor Activity:No psychomotor agitation or retardation noted   Eye Contact: good Speech: normal amount, tone, volume and latency   Mood: euthymic Affect: congruent, pleasant and interactive  Thought Process: linear, goal directed, no circumstantial or tangential thought process noted, no racing thoughts or flight of ideas Descriptions of Associations: intact Thought Content: Hallucinations: denies AH, VH , does not appear responding to stimuli Delusions: No paranoia or other delusions noted Suicidal Thoughts: denies SI, intention, plan  Homicidal Thoughts: denies HI, intention, plan   Alertness/Orientation: alert and fully oriented  Insight: fair, improved Judgment: fair, improved  Memory: intact  Executive Functions  Concentration: intact  Attention Span: Fair Recall: intact Fund of Knowledge: fair   Art therapist  Concentration: intact Attention Span: Fair Recall: intact Fund of Knowledge: fair   Assets  Assets: Manufacturing systems engineer; Intimacy; Housing; Desire for Improvement; Physical Health   Physical Exam: Physical Exam ROS Blood pressure (!) 132/93, pulse 70, temperature 97.6 F (36.4 C), temperature source Oral, resp. rate 16, height 6' (1.829 m), weight 80.7 kg, SpO2 100%. Body mass index is 24.14 kg/m.  Mental Status Per Nursing Assessment::   On Admission:  Self-harm thoughts  Demographic Factors:  Adolescent or young adult and Caucasian  Loss  Factors: NA  Historical Factors: Impulsivity  Risk Reduction Factors:   Responsible for children under 30 years of age, Sense  of responsibility to family, Employed, Living with another person, especially a relative, and Positive social support  Continued Clinical Symptoms: Symptoms improved significantly during hospital stay Depression:   Impulsivity Alcohol/Substance Abuse/Dependencies  Cognitive Features That Contribute To Risk:  None    Suicide Risk:  Minimal: No identifiable suicidal ideation.  Patients presenting with no risk factors but with morbid ruminations; may be classified as minimal risk based on the severity of the depressive symptoms   Follow-up Information     [facility]. Go on 05/31/2023.   Specialty: Behavioral Health Why: Please go to this provider for therapy and medication management services. For faster service, go on Monday, by 7:00 am because currently there appointments are backed up until September. Contact information: [address] [place] [address] [phone]                Plan Of Care/Follow-up recommendations:    Discharge recommendations:     Activity: as tolerated  Diet: heart healthy  # It is recommended to the patient to continue psychiatric medications as prescribed, after discharge from the hospital.     # It is recommended to the patient to follow up with your outpatient psychiatric provider and PCP.   # It was discussed with the patient, the impact of alcohol, drugs, tobacco have been there overall psychiatric and medical wellbeing, and total abstinence from substance use was recommended the patient.ed.   # Prescriptions provided or sent directly to preferred pharmacy at discharge. Patient agreeable to plan. Given opportunity to ask questions. Appears to feel comfortable with discharge.    # In the event of worsening symptoms, the patient is instructed to call the crisis hotline, 911 and or go to the nearest ED for appropriate evaluation and treatment of symptoms. To follow-up with primary care provider for  other medical issues, concerns and or health care needs   # Patient was discharged home with a plan to follow up as noted above.   Patient agrees with D/C instructions and plan.  The patient received suicide prevention pamphlet:  Yes Belongings returned:  Clothing and Valuables  Total Time Spent in Direct Patient Care:  I personally spent 45 minutes on the unit in direct patient care. The direct patient care time included face-to-face time with the patient, reviewing the patient's chart, communicating with other professionals, and coordinating care. Greater than 50% of this time was spent in counseling or coordinating care with the patient regarding goals of hospitalization, psycho-education, and discharge planning needs.   [name] [name] 05/28/2023, 9:22 AM   [name] Dulany Abbott Pao, MD 05/28/2023, 9:22 AM

## 2023-05-28 NOTE — Plan of Care (Signed)
  Problem: Education: Goal: Knowledge of [place] [facility] Education information/materials will improve Outcome: Progressing Goal: Emotional status will improve Outcome: Progressing Goal: Mental status will improve Outcome: Progressing Goal: Verbalization of understanding the information provided will improve Outcome: Progressing   Problem: Activity: Goal: Interest or engagement in activities will improve Outcome: Progressing Goal: Sleeping patterns will improve Outcome: Progressing   Problem: Coping: Goal: Ability to verbalize frustrations and anger appropriately will improve Outcome: Progressing Goal: Ability to demonstrate self-control will improve Outcome: Progressing   Problem: Health Behavior/Discharge Planning: Goal: Identification of resources available to assist in meeting health care needs will improve Outcome: Progressing Goal: Compliance with treatment plan for underlying cause of condition will improve Outcome: Progressing   Problem: Physical Regulation: Goal: Ability to maintain clinical measurements within normal limits will improve Outcome: Progressing   Problem: Safety: Goal: Periods of time without injury will increase Outcome: Progressing   Problem: Health Behavior/Discharge Planning: Goal: Identification of resources available to assist in meeting health care needs will improve Outcome: Progressing   Problem: Medication: Goal: Compliance with prescribed medication regimen will improve Outcome: Progressing   Problem: Self-Concept: Goal: Ability to disclose and discuss suicidal ideas will improve Outcome: Progressing Goal: Will verbalize positive feelings about self Outcome: Progressing   Problem: Coping: Goal: Ability to identify and develop effective coping behavior will improve Outcome: Progressing   Problem: Activity: Goal: Interest or engagement in leisure activities will improve Outcome: Progressing Goal: Imbalance in normal  sleep/wake cycle will improve Outcome: Progressing   Problem: Coping: Goal: Coping ability will improve Outcome: Progressing Goal: Will verbalize feelings Outcome: Progressing
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
## 2023-05-28 NOTE — Progress Notes (Signed)
   05/28/23 0758  Psych Admission Type (Psych Patients Only)  Admission Status Involuntary  Psychosocial Assessment  Patient Complaints None  Eye Contact Fair  Facial Expression Animated  Affect Appropriate to circumstance  Speech Logical/coherent  Interaction Assertive  Motor Activity Other (Comment) (WDL)  Appearance/Hygiene Unremarkable  Behavior Characteristics Cooperative;Appropriate to situation  Mood Pleasant  Thought Process  Coherency WDL  Content WDL  Delusions None reported or observed  Perception WDL  Hallucination None reported or observed  Judgment Impaired  Confusion None  Danger to Self  Current suicidal ideation? Denies  Agreement Not to Harm Self Yes  Description of Agreement Verbal  Danger to Others  Danger to Others None reported or observed

## 2023-06-02 ENCOUNTER — Other Ambulatory Visit: Payer: Self-pay

## 2023-06-02 ENCOUNTER — Emergency Department (HOSPITAL_COMMUNITY): Payer: Medicaid Other

## 2023-06-02 ENCOUNTER — Encounter (HOSPITAL_COMMUNITY): Payer: Self-pay | Admitting: Emergency Medicine

## 2023-06-02 ENCOUNTER — Emergency Department (HOSPITAL_COMMUNITY)
Admission: EM | Admit: 2023-06-02 | Discharge: 2023-06-02 | Disposition: A | Discharge status: Home / Self Care | Payer: Medicaid Other | Attending: Emergency Medicine | Admitting: Emergency Medicine

## 2023-06-02 DIAGNOSIS — S61021A Laceration with foreign body of right thumb without damage to nail, initial encounter: Secondary | ICD-10-CM

## 2023-06-02 DIAGNOSIS — S61511A Laceration without foreign body of right wrist, initial encounter: Secondary | ICD-10-CM

## 2023-06-02 DIAGNOSIS — S61011A Laceration without foreign body of right thumb without damage to nail, initial encounter: Secondary | ICD-10-CM | POA: Diagnosis not present

## 2023-06-02 DIAGNOSIS — W228XXA Striking against or struck by other objects, initial encounter: Secondary | ICD-10-CM | POA: Insufficient documentation

## 2023-06-02 MED ORDER — BACITRACIN ZINC 500 UNIT/GM EX OINT
TOPICAL_OINTMENT | Freq: Two times a day (BID) | CUTANEOUS | Status: DC
  Filled 2023-06-02: qty 0.9

## 2023-06-02 MED ORDER — LIDOCAINE HCL (PF) 1 % IJ SOLN
30.0000 mL | Freq: Once | INTRAMUSCULAR | Status: AC
  Administered 2023-06-02: 30 mL
  Filled 2023-06-02: qty 30

## 2023-06-02 MED ORDER — CEPHALEXIN 500 MG PO CAPS: 500.0000 mg | ORAL_CAPSULE | Freq: Four times a day (QID) | ORAL | 0 refills | Status: AC
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
## 2023-06-02 NOTE — Discharge Instructions (Addendum)
Please follow-up with your primary care provider potentially for your choosing regarding recent symptoms and ER visit.  Today you have a laceration with 6 sutures placed you will need to have these removed in 7 days by either primary care provider, urgent care, ER.  I have also prescribed Keflex due to having a foreign body in your right thumb.  Please keep your lacerations clean and dry and replace dressing daily.  You may use Neosporin ointment.  If symptoms change or worsen please return to ER.
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
## 2023-06-02 NOTE — ED Notes (Signed)
Left wrist flushed with sterile water

## 2023-06-02 NOTE — ED Notes (Signed)
Pharmacy called about missing Bacitracin in the Pyxis.

## 2023-06-03 ENCOUNTER — Encounter (HOSPITAL_COMMUNITY): Payer: Self-pay | Admitting: Nurse Practitioner
